# Patient Record
Sex: Female | Born: 1980 | Race: Black or African American | Hispanic: No | Marital: Married | Smoking: Never smoker
Health system: Southern US, Community
[De-identification: ages and names within clinical notes are randomized; demographics above are authoritative.]

## PROBLEM LIST (undated history)

## (undated) ENCOUNTER — Inpatient Hospital Stay (HOSPITAL_COMMUNITY): Payer: Self-pay

## (undated) DIAGNOSIS — F329 Major depressive disorder, single episode, unspecified: Secondary | ICD-10-CM

## (undated) DIAGNOSIS — M797 Fibromyalgia: Secondary | ICD-10-CM

## (undated) DIAGNOSIS — F419 Anxiety disorder, unspecified: Secondary | ICD-10-CM

## (undated) DIAGNOSIS — D649 Anemia, unspecified: Secondary | ICD-10-CM

## (undated) DIAGNOSIS — M069 Rheumatoid arthritis, unspecified: Secondary | ICD-10-CM

## (undated) DIAGNOSIS — K6289 Other specified diseases of anus and rectum: Secondary | ICD-10-CM

## (undated) DIAGNOSIS — F32A Depression, unspecified: Secondary | ICD-10-CM

## (undated) DIAGNOSIS — K5190 Ulcerative colitis, unspecified, without complications: Secondary | ICD-10-CM

## (undated) DIAGNOSIS — Z803 Family history of malignant neoplasm of breast: Secondary | ICD-10-CM

## (undated) HISTORY — DX: Family history of malignant neoplasm of breast: Z80.3

## (undated) HISTORY — DX: Fibromyalgia: M79.7

## (undated) HISTORY — DX: Other specified diseases of anus and rectum: K62.89

---

## 2008-09-18 ENCOUNTER — Encounter: Admission: RE | Admit: 2008-09-18 | Discharge: 2008-09-18 | Payer: Self-pay | Admitting: Family Medicine

## 2011-06-16 DIAGNOSIS — Z3480 Encounter for supervision of other normal pregnancy, unspecified trimester: Secondary | ICD-10-CM | POA: Diagnosis not present

## 2011-06-16 DIAGNOSIS — R946 Abnormal results of thyroid function studies: Secondary | ICD-10-CM | POA: Diagnosis not present

## 2011-07-07 DIAGNOSIS — R110 Nausea: Secondary | ICD-10-CM | POA: Diagnosis not present

## 2011-07-07 DIAGNOSIS — E559 Vitamin D deficiency, unspecified: Secondary | ICD-10-CM | POA: Diagnosis not present

## 2011-07-07 DIAGNOSIS — Z331 Pregnant state, incidental: Secondary | ICD-10-CM | POA: Diagnosis not present

## 2011-07-07 DIAGNOSIS — Z3480 Encounter for supervision of other normal pregnancy, unspecified trimester: Secondary | ICD-10-CM | POA: Diagnosis not present

## 2011-07-08 DIAGNOSIS — O26849 Uterine size-date discrepancy, unspecified trimester: Secondary | ICD-10-CM | POA: Diagnosis not present

## 2011-07-08 DIAGNOSIS — O209 Hemorrhage in early pregnancy, unspecified: Secondary | ICD-10-CM | POA: Diagnosis not present

## 2011-07-09 ENCOUNTER — Other Ambulatory Visit: Payer: Self-pay | Admitting: Obstetrics and Gynecology

## 2011-07-09 DIAGNOSIS — Z3682 Encounter for antenatal screening for nuchal translucency: Secondary | ICD-10-CM

## 2011-07-15 ENCOUNTER — Ambulatory Visit (HOSPITAL_COMMUNITY): Payer: Self-pay

## 2011-08-05 DIAGNOSIS — O4410 Placenta previa with hemorrhage, unspecified trimester: Secondary | ICD-10-CM | POA: Diagnosis not present

## 2011-08-06 DIAGNOSIS — Z3480 Encounter for supervision of other normal pregnancy, unspecified trimester: Secondary | ICD-10-CM | POA: Diagnosis not present

## 2011-08-20 DIAGNOSIS — A7489 Other chlamydial diseases: Secondary | ICD-10-CM | POA: Diagnosis not present

## 2011-09-01 DIAGNOSIS — O4410 Placenta previa with hemorrhage, unspecified trimester: Secondary | ICD-10-CM | POA: Diagnosis not present

## 2011-09-01 DIAGNOSIS — Z1389 Encounter for screening for other disorder: Secondary | ICD-10-CM | POA: Diagnosis not present

## 2011-10-01 DIAGNOSIS — Z118 Encounter for screening for other infectious and parasitic diseases: Secondary | ICD-10-CM | POA: Diagnosis not present

## 2011-10-01 DIAGNOSIS — N898 Other specified noninflammatory disorders of vagina: Secondary | ICD-10-CM | POA: Diagnosis not present

## 2011-10-01 DIAGNOSIS — O9989 Other specified diseases and conditions complicating pregnancy, childbirth and the puerperium: Secondary | ICD-10-CM | POA: Diagnosis not present

## 2011-10-01 DIAGNOSIS — O26899 Other specified pregnancy related conditions, unspecified trimester: Secondary | ICD-10-CM | POA: Diagnosis not present

## 2011-10-21 DIAGNOSIS — Z3480 Encounter for supervision of other normal pregnancy, unspecified trimester: Secondary | ICD-10-CM | POA: Diagnosis not present

## 2011-12-30 DIAGNOSIS — Z3480 Encounter for supervision of other normal pregnancy, unspecified trimester: Secondary | ICD-10-CM | POA: Diagnosis not present

## 2012-01-13 DIAGNOSIS — N898 Other specified noninflammatory disorders of vagina: Secondary | ICD-10-CM | POA: Diagnosis not present

## 2012-01-13 DIAGNOSIS — O9989 Other specified diseases and conditions complicating pregnancy, childbirth and the puerperium: Secondary | ICD-10-CM | POA: Diagnosis not present

## 2012-01-13 DIAGNOSIS — Z8619 Personal history of other infectious and parasitic diseases: Secondary | ICD-10-CM | POA: Diagnosis not present

## 2012-01-19 DIAGNOSIS — Z8619 Personal history of other infectious and parasitic diseases: Secondary | ICD-10-CM | POA: Diagnosis not present

## 2012-01-19 DIAGNOSIS — O98319 Other infections with a predominantly sexual mode of transmission complicating pregnancy, unspecified trimester: Secondary | ICD-10-CM | POA: Diagnosis not present

## 2012-01-19 DIAGNOSIS — Z3480 Encounter for supervision of other normal pregnancy, unspecified trimester: Secondary | ICD-10-CM | POA: Diagnosis not present

## 2012-01-27 ENCOUNTER — Encounter (HOSPITAL_COMMUNITY): Payer: Self-pay | Admitting: *Deleted

## 2012-01-27 ENCOUNTER — Encounter (HOSPITAL_COMMUNITY)
Admission: AD | Disposition: A | Discharge status: Home / Self Care | Payer: Self-pay | Source: Ambulatory Visit | Attending: Obstetrics and Gynecology

## 2012-01-27 ENCOUNTER — Inpatient Hospital Stay (HOSPITAL_COMMUNITY)
Admission: AD | Admit: 2012-01-27 | Discharge: 2012-01-30 | DRG: 765 | Disposition: A | Discharge status: Home / Self Care | Payer: Medicare Other | Source: Ambulatory Visit | Attending: Obstetrics and Gynecology | Admitting: Obstetrics and Gynecology

## 2012-01-27 ENCOUNTER — Inpatient Hospital Stay (HOSPITAL_COMMUNITY): Payer: Medicare Other | Admitting: Anesthesiology

## 2012-01-27 ENCOUNTER — Other Ambulatory Visit: Payer: Self-pay | Admitting: Obstetrics and Gynecology

## 2012-01-27 ENCOUNTER — Inpatient Hospital Stay (HOSPITAL_COMMUNITY): Payer: Medicare Other

## 2012-01-27 ENCOUNTER — Encounter (HOSPITAL_COMMUNITY): Payer: Self-pay | Admitting: Anesthesiology

## 2012-01-27 DIAGNOSIS — O99892 Other specified diseases and conditions complicating childbirth: Secondary | ICD-10-CM | POA: Diagnosis present

## 2012-01-27 DIAGNOSIS — Z22330 Carrier of Group B streptococcus: Secondary | ICD-10-CM

## 2012-01-27 DIAGNOSIS — D696 Thrombocytopenia, unspecified: Secondary | ICD-10-CM | POA: Diagnosis present

## 2012-01-27 DIAGNOSIS — Z98891 History of uterine scar from previous surgery: Secondary | ICD-10-CM

## 2012-01-27 DIAGNOSIS — O9912 Other diseases of the blood and blood-forming organs and certain disorders involving the immune mechanism complicating childbirth: Secondary | ICD-10-CM | POA: Diagnosis present

## 2012-01-27 DIAGNOSIS — O4590 Premature separation of placenta, unspecified, unspecified trimester: Secondary | ICD-10-CM | POA: Diagnosis present

## 2012-01-27 DIAGNOSIS — D689 Coagulation defect, unspecified: Secondary | ICD-10-CM | POA: Diagnosis present

## 2012-01-27 DIAGNOSIS — O4690 Antepartum hemorrhage, unspecified, unspecified trimester: Secondary | ICD-10-CM

## 2012-01-27 DIAGNOSIS — O368390 Maternal care for abnormalities of the fetal heart rate or rhythm, unspecified trimester, not applicable or unspecified: Secondary | ICD-10-CM

## 2012-01-27 DIAGNOSIS — O468X9 Other antepartum hemorrhage, unspecified trimester: Secondary | ICD-10-CM | POA: Diagnosis not present

## 2012-01-27 DIAGNOSIS — Z331 Pregnant state, incidental: Secondary | ICD-10-CM | POA: Diagnosis not present

## 2012-01-27 LAB — CBC
HCT: 35.5 % — ABNORMAL LOW (ref 36.0–46.0)
Hemoglobin: 11.7 g/dL — ABNORMAL LOW (ref 12.0–15.0)
MCH: 27 pg (ref 26.0–34.0)
MCV: 81.8 fL (ref 78.0–100.0)
Platelets: 148 10*3/uL — ABNORMAL LOW (ref 150–400)
RBC: 4.34 MIL/uL (ref 3.87–5.11)
WBC: 8.6 10*3/uL (ref 4.0–10.5)

## 2012-01-27 LAB — KLEIHAUER-BETKE STAIN
Fetal Cells %: 0 %
Quantitation Fetal Hemoglobin: 0 mL

## 2012-01-27 LAB — SAVE SMEAR

## 2012-01-27 LAB — FIBRINOGEN: Fibrinogen: 463 mg/dL (ref 204–475)

## 2012-01-27 LAB — OB RESULTS CONSOLE RUBELLA ANTIBODY, IGM: Rubella: IMMUNE

## 2012-01-27 LAB — TYPE AND SCREEN
Antibody Screen: NEGATIVE
Unit division: 0

## 2012-01-27 LAB — PROTIME-INR
INR: 0.95 (ref 0.00–1.49)
Prothrombin Time: 12.6 seconds (ref 11.6–15.2)

## 2012-01-27 LAB — ABO/RH: ABO/RH(D): B POS

## 2012-01-27 SURGERY — Surgical Case
Anesthesia: Spinal | Wound class: Clean Contaminated

## 2012-01-27 MED ORDER — CITRIC ACID-SODIUM CITRATE 334-500 MG/5ML PO SOLN
30.0000 mL | ORAL | Status: DC | PRN
  Administered 2012-01-27: 30 mL via ORAL
  Filled 2012-01-27: qty 15

## 2012-01-27 MED ORDER — LIDOCAINE HCL (PF) 1 % IJ SOLN: 30.0000 mL | INTRAMUSCULAR | Status: DC | PRN

## 2012-01-27 MED ORDER — OXYTOCIN 40 UNITS IN LACTATED RINGERS INFUSION - SIMPLE MED: 62.5000 mL/h | INTRAVENOUS | Status: DC

## 2012-01-27 MED ORDER — LACTATED RINGERS IV SOLN
INTRAVENOUS | Status: DC
  Administered 2012-01-27 (×3): via INTRAVENOUS

## 2012-01-27 MED ORDER — LACTATED RINGERS IV SOLN: 500.0000 mL | INTRAVENOUS | Status: DC | PRN

## 2012-01-27 MED ORDER — MORPHINE SULFATE 0.5 MG/ML IJ SOLN
INTRAMUSCULAR | Status: AC
  Filled 2012-01-27: qty 10

## 2012-01-27 MED ORDER — LACTATED RINGERS IV SOLN
INTRAVENOUS | Status: DC | PRN
  Administered 2012-01-27 (×2): via INTRAVENOUS

## 2012-01-27 MED ORDER — PHENYLEPHRINE 40 MCG/ML (10ML) SYRINGE FOR IV PUSH (FOR BLOOD PRESSURE SUPPORT)
PREFILLED_SYRINGE | INTRAVENOUS | Status: AC
  Filled 2012-01-27: qty 5

## 2012-01-27 MED ORDER — CEFAZOLIN SODIUM-DEXTROSE 2-3 GM-% IV SOLR
INTRAVENOUS | Status: AC
  Filled 2012-01-27: qty 50

## 2012-01-27 MED ORDER — ONDANSETRON HCL 4 MG/2ML IJ SOLN
INTRAMUSCULAR | Status: DC | PRN
  Administered 2012-01-27: 4 mg via INTRAVENOUS

## 2012-01-27 MED ORDER — TERBUTALINE SULFATE 1 MG/ML IJ SOLN: 0.2500 mg | Freq: Once | INTRAMUSCULAR | Status: DC | PRN

## 2012-01-27 MED ORDER — CEFAZOLIN SODIUM-DEXTROSE 2-3 GM-% IV SOLR
INTRAVENOUS | Status: DC | PRN
  Administered 2012-01-27: 2 g via INTRAVENOUS

## 2012-01-27 MED ORDER — ONDANSETRON HCL 4 MG/2ML IJ SOLN
INTRAMUSCULAR | Status: AC
  Filled 2012-01-27: qty 2

## 2012-01-27 MED ORDER — ONDANSETRON HCL 4 MG/2ML IJ SOLN: 4.0000 mg | Freq: Four times a day (QID) | INTRAMUSCULAR | Status: DC | PRN

## 2012-01-27 MED ORDER — PENICILLIN G POTASSIUM 5000000 UNITS IJ SOLR
2.5000 10*6.[IU] | INTRAVENOUS | Status: DC
  Administered 2012-01-27: 2.5 10*6.[IU] via INTRAVENOUS
  Filled 2012-01-27 (×3): qty 2.5

## 2012-01-27 MED ORDER — KETOROLAC TROMETHAMINE 60 MG/2ML IM SOLN
60.0000 mg | Freq: Once | INTRAMUSCULAR | Status: AC | PRN
  Administered 2012-01-27: 60 mg via INTRAMUSCULAR

## 2012-01-27 MED ORDER — MEPERIDINE HCL 25 MG/ML IJ SOLN
INTRAMUSCULAR | Status: AC
  Filled 2012-01-27: qty 1

## 2012-01-27 MED ORDER — PHENYLEPHRINE HCL 10 MG/ML IJ SOLN
INTRAMUSCULAR | Status: DC | PRN
  Administered 2012-01-27 (×2): 80 ug via INTRAVENOUS
  Administered 2012-01-27: 40 ug via INTRAVENOUS

## 2012-01-27 MED ORDER — OXYTOCIN BOLUS FROM INFUSION
500.0000 mL | INTRAVENOUS | Status: DC
  Filled 2012-01-27: qty 500

## 2012-01-27 MED ORDER — IBUPROFEN 600 MG PO TABS: 600.0000 mg | ORAL_TABLET | Freq: Four times a day (QID) | ORAL | Status: DC | PRN

## 2012-01-27 MED ORDER — FENTANYL CITRATE 0.05 MG/ML IJ SOLN
INTRAMUSCULAR | Status: DC | PRN
  Administered 2012-01-27: 25 ug via INTRATHECAL
  Administered 2012-01-27: 75 ug via INTRAVENOUS

## 2012-01-27 MED ORDER — OXYTOCIN 10 UNIT/ML IJ SOLN
INTRAMUSCULAR | Status: AC
  Filled 2012-01-27: qty 4

## 2012-01-27 MED ORDER — BUTORPHANOL TARTRATE 1 MG/ML IJ SOLN: 1.0000 mg | INTRAMUSCULAR | Status: DC | PRN

## 2012-01-27 MED ORDER — HYDROMORPHONE HCL PF 1 MG/ML IJ SOLN
INTRAMUSCULAR | Status: AC
  Filled 2012-01-27: qty 1

## 2012-01-27 MED ORDER — HYDROMORPHONE HCL PF 1 MG/ML IJ SOLN
0.2500 mg | INTRAMUSCULAR | Status: DC | PRN
  Administered 2012-01-27: 0.5 mg via INTRAVENOUS

## 2012-01-27 MED ORDER — OXYTOCIN 40 UNITS IN LACTATED RINGERS INFUSION - SIMPLE MED
1.0000 m[IU]/min | INTRAVENOUS | Status: DC
  Administered 2012-01-27: 1 m[IU]/min via INTRAVENOUS
  Filled 2012-01-27: qty 1000

## 2012-01-27 MED ORDER — SCOPOLAMINE 1 MG/3DAYS TD PT72
MEDICATED_PATCH | TRANSDERMAL | Status: AC
  Filled 2012-01-27: qty 1

## 2012-01-27 MED ORDER — FENTANYL CITRATE 0.05 MG/ML IJ SOLN
INTRAMUSCULAR | Status: AC
Start: 2012-01-27 — End: 2012-01-27
  Filled 2012-01-27: qty 2

## 2012-01-27 MED ORDER — OXYCODONE-ACETAMINOPHEN 5-325 MG PO TABS: 1.0000 | ORAL_TABLET | ORAL | Status: DC | PRN

## 2012-01-27 MED ORDER — KETOROLAC TROMETHAMINE 60 MG/2ML IM SOLN
INTRAMUSCULAR | Status: AC
  Filled 2012-01-27: qty 2

## 2012-01-27 MED ORDER — OXYTOCIN 40 UNITS IN LACTATED RINGERS INFUSION - SIMPLE MED
INTRAVENOUS | Status: DC | PRN
  Administered 2012-01-27: 40 [IU] via INTRAVENOUS

## 2012-01-27 MED ORDER — SCOPOLAMINE 1 MG/3DAYS TD PT72
1.0000 | MEDICATED_PATCH | Freq: Once | TRANSDERMAL | Status: DC
  Administered 2012-01-27: 1.5 mg via TRANSDERMAL

## 2012-01-27 MED ORDER — DEXTROSE 5 % IV SOLN
5.0000 10*6.[IU] | Freq: Once | INTRAVENOUS | Status: AC
  Administered 2012-01-27: 5 10*6.[IU] via INTRAVENOUS
  Filled 2012-01-27: qty 5

## 2012-01-27 MED ORDER — GLYCOPYRROLATE 0.2 MG/ML IJ SOLN
INTRAMUSCULAR | Status: DC | PRN
  Administered 2012-01-27: 0.2 mg via INTRAVENOUS

## 2012-01-27 MED ORDER — MEPERIDINE HCL 25 MG/ML IJ SOLN
6.2500 mg | INTRAMUSCULAR | Status: DC | PRN
  Administered 2012-01-27: 6.25 mg via INTRAVENOUS

## 2012-01-27 MED ORDER — ACETAMINOPHEN 325 MG PO TABS: 650.0000 mg | ORAL_TABLET | ORAL | Status: DC | PRN

## 2012-01-27 MED ORDER — MORPHINE SULFATE (PF) 0.5 MG/ML IJ SOLN
INTRAMUSCULAR | Status: DC | PRN
  Administered 2012-01-27: .15 ug via INTRATHECAL

## 2012-01-27 SURGICAL SUPPLY — 37 items
BARRIER ADHS 3X4 INTERCEED (GAUZE/BANDAGES/DRESSINGS) ×2 IMPLANT
BENZOIN TINCTURE PRP APPL 2/3 (GAUZE/BANDAGES/DRESSINGS) ×4 IMPLANT
CLOTH BEACON ORANGE TIMEOUT ST (SAFETY) ×2 IMPLANT
DERMABOND ADVANCED (GAUZE/BANDAGES/DRESSINGS)
DERMABOND ADVANCED .7 DNX12 (GAUZE/BANDAGES/DRESSINGS) IMPLANT
DRAPE SURG 17X23 STRL (DRAPES) ×2 IMPLANT
DRESSING TELFA 8X3 (GAUZE/BANDAGES/DRESSINGS) ×2 IMPLANT
DRSG COVADERM 4X10 (GAUZE/BANDAGES/DRESSINGS) IMPLANT
DURAPREP 26ML APPLICATOR (WOUND CARE) ×2 IMPLANT
ELECT REM PT RETURN 9FT ADLT (ELECTROSURGICAL) ×2
ELECTRODE REM PT RTRN 9FT ADLT (ELECTROSURGICAL) ×1 IMPLANT
EXTRACTOR VACUUM BELL STYLE (SUCTIONS) IMPLANT
GAUZE SPONGE 4X4 12PLY STRL LF (GAUZE/BANDAGES/DRESSINGS) ×4 IMPLANT
GLOVE BIO SURGEON STRL SZ7 (GLOVE) ×2 IMPLANT
GLOVE BIOGEL PI IND STRL 7.0 (GLOVE) ×2 IMPLANT
GLOVE BIOGEL PI INDICATOR 7.0 (GLOVE) ×2
GOWN PREVENTION PLUS LG XLONG (DISPOSABLE) ×4 IMPLANT
GOWN PREVENTION PLUS XLARGE (GOWN DISPOSABLE) IMPLANT
KIT ABG SYR 3ML LUER SLIP (SYRINGE) IMPLANT
NEEDLE HYPO 25X5/8 SAFETYGLIDE (NEEDLE) IMPLANT
NS IRRIG 1000ML POUR BTL (IV SOLUTION) ×2 IMPLANT
PACK C SECTION WH (CUSTOM PROCEDURE TRAY) ×2 IMPLANT
PAD ABD 7.5X8 STRL (GAUZE/BANDAGES/DRESSINGS) ×4 IMPLANT
PAD OB MATERNITY 4.3X12.25 (PERSONAL CARE ITEMS) IMPLANT
RTRCTR C-SECT PINK 25CM LRG (MISCELLANEOUS) ×2 IMPLANT
SLEEVE SCD COMPRESS KNEE MED (MISCELLANEOUS) IMPLANT
STRIP CLOSURE SKIN 1/2X4 (GAUZE/BANDAGES/DRESSINGS) ×2 IMPLANT
SUT CHROMIC 0 CTX 36 (SUTURE) ×10 IMPLANT
SUT PLAIN 2 0 (SUTURE)
SUT PLAIN 2 0 XLH (SUTURE) ×2 IMPLANT
SUT PLAIN ABS 2-0 54XMFL TIE (SUTURE) IMPLANT
SUT VIC AB 0 CT1 27 (SUTURE) ×2
SUT VIC AB 0 CT1 27XBRD ANBCTR (SUTURE) ×2 IMPLANT
SUT VIC AB 4-0 KS 27 (SUTURE) ×2 IMPLANT
TOWEL OR 17X24 6PK STRL BLUE (TOWEL DISPOSABLE) ×4 IMPLANT
TRAY FOLEY CATH 14FR (SET/KITS/TRAYS/PACK) ×4 IMPLANT
WATER STERILE IRR 1000ML POUR (IV SOLUTION) ×2 IMPLANT

## 2012-01-27 NOTE — Transfer of Care (Signed)
Immediate Anesthesia Transfer of Care Note  Patient: Brittany Vasquez  Procedure(s) Performed: Procedure(s) (LRB) with comments: CESAREAN SECTION (N/A) - Primary Cesarean Section with birth of baby boy @ 33  Patient Location: PACU  Anesthesia Type: Spinal  Level of Consciousness: awake, alert  and oriented  Airway & Oxygen Therapy: Patient Spontanous Breathing  Post-op Assessment: Report given to PACU RN and Post -op Vital signs reviewed and stable  Post vital signs: Reviewed and stable  Complications: No apparent anesthesia complications

## 2012-01-27 NOTE — Progress Notes (Signed)
Called Dr Dion Body to update on FHR, UCs, Pit on 1 mU/min. D/c Pit, check cervix/assess bleeding, call if no accels x30 min.

## 2012-01-27 NOTE — Anesthesia Procedure Notes (Signed)
Spinal  Patient location during procedure: OR Preanesthetic Checklist Completed: patient identified, site marked, surgical consent, pre-op evaluation, timeout performed, IV checked, risks and benefits discussed and monitors and equipment checked Spinal Block Patient position: sitting Prep: DuraPrep Patient monitoring: heart rate, cardiac monitor, continuous pulse ox and blood pressure Approach: midline Location: L3-4 Injection technique: single-shot Needle Needle type: Sprotte  Needle gauge: 24 G Needle length: 9 cm Assessment Sensory level: T4 Additional Notes Spinal Dosage in OR  Bupivicaine ml       1.7 PFMS04   mcg        150 Fentanyl mcg            25    

## 2012-01-27 NOTE — H&P (Signed)
Brittany Vasquez is a 31 y.o. female G2 P1001 at 37 1/7 weeks admitting for vaginal bleeding.  Pt presented at the office with a complaint of malaise, occ contractions overnight and vaginal bleeding.  Pt denies significant LOF except bleeding.  Fetus has been active. Thought pt was in labor but cervix was closed and thick.  Moderate blood noted in the vault.  Pregnancy was complicated by Chlamydia at 10 weeks, neg TOC x 2.  Complete placenta previa noted at 12 weeks, resolved at 19 week ultrasound.  GBS+  Maternal Medical History:  Reason for admission: Reason for admission: vaginal bleeding.  Contractions: Onset was 6-12 hours ago.   Frequency: rare.   Perceived severity is mild.    Fetal activity: Perceived fetal activity is normal.    Prenatal complications: Marginal placenta previa. Resolved at 19 weeks.  Prenatal Complications - Diabetes: none.    OB History    Grav Para Term Preterm Abortions TAB SAB Ect Mult Living   2 1 1  0 0 0 0 0 0 1     Past Medical History  Diagnosis Date  . No pertinent past medical history    Past Surgical History  Procedure Date  . No past surgeries    Family History: family history is not on file. Social History:  reports that she has never smoked. She has never used smokeless tobacco. She reports that she does not drink alcohol or use illicit drugs.   Prenatal Transfer Tool  Maternal Diabetes: No Genetic Screening: Declined Maternal Ultrasounds/Referrals: Abnormal:  Findings:   Isolated EIF (echogenic intracardiac focus), Other: AFI 6.5 day of delivery.  Complete placenta previa resolved. Fetal Ultrasounds or other Referrals:  None Maternal Substance Abuse:  No Significant Maternal Medications:  None  Zithromax for Chlamydia Significant Maternal Lab Results:  Lab values include: Group B Strep positive, Other: Chlamydia 1st trimester Other Comments:  None  Review of Systems  Constitutional: Negative for fever and chills.    Gastrointestinal: Positive for abdominal pain.  Musculoskeletal: Positive for back pain.   Closed by my exam.  RN states 1 cm. Dilation: 1 Effacement (%): 50 Station: -2 Exam by:: Dr Dion Body Blood pressure 92/64, pulse 62, temperature 98.2 F (36.8 C), temperature source Oral, resp. rate 20, height 5\' 7"  (1.702 m), weight 83.462 kg (184 lb), SpO2 96.00%. Maternal Exam:  Uterine Assessment: Contraction strength is moderate.  Contraction duration is 2 minutes. Contraction frequency is irregular.   Abdomen: Estimated fetal weight is 6 pounds.   Fetal presentation: vertex  Introitus: Normal vulva. Vagina is positive for vaginal discharge.  Ferning test: negative.  Amniotic fluid character: not assessed.  Pelvis: adequate for delivery.   Cervix: Cervix evaluated by digital exam.     Fetal Exam Fetal Monitor Review: Baseline rate: 140s, reactive.  .  Variability: moderate (6-25 bpm).   Pattern: late decelerations.    Fetal State Assessment: Category I - tracings are normal. Reassuring if not contracting.  Physical Exam  Constitutional: She is oriented to person, place, and time. She appears well-developed and well-nourished. No distress.  HENT:  Head: Atraumatic.       Caput narrow  Eyes: EOM are normal.  Neck: Normal range of motion.  GI: There is no tenderness. There is no rebound and no guarding.  Genitourinary: Vaginal discharge found.       No active bleeding but small amount of blood in vault. Small clot.  Musculoskeletal: She exhibits no edema and no tenderness.  Neurological: She is  alert and oriented to person, place, and time.  Skin: Skin is warm and dry. She is not diaphoretic.  Psychiatric: She has a normal mood and affect.    Prenatal labs: ABO, Rh: --/--/B POS, B POS (10/24 1255) Antibody: NEG (10/24 1255) Rubella: Immune (10/24 0000) RPR: Nonreactive (10/24 0000)  HBsAg: Negative (10/24 0000)  HIV: Non-reactive (10/24 0000)  GBS: Positive (10/24 0000)    Ultrasound- No obvious placental abruption, AFI 6.5 KB stain neg, DIC panel neg. Hg 11.7, platelets 148  Assessment/Plan: Pregnancy at 40 1/7 weeks. Vaginal bleeding-If pt were laboring and dilated, bleeding could possible be c/w bloody show although it would be a little more than anticipated.  Due to late decelerations, closed cervix (ie- not in labor), I suspect pt may have a mild placental abruption.  Pt with h/o complete placenta previa noted in first trimester but had resolved in second trimester. In that pt had late decels with 1 mu of Pitocin, I recommend proceeding with c-section b/c baby will likely not tolerate labor and abruption could worsening requiring emergency c-section with risk to fetus and pt.  Fetal tracing reviewed with pt and FOB, discussed concerns and rationale of recommendations. All questions answered.  R/B/A reviewed with pt, all questions answered.  Consent signed.     Geryl Rankins 01/27/2012, 8:38 PM

## 2012-01-27 NOTE — Brief Op Note (Signed)
01/27/2012  8:07 PM  PATIENT:  Arby Barrette  31 y.o. female  PRE-OPERATIVE DIAGNOSIS:  Pregnancy at 40 1/7 weeks, Non-Reassuring Fetal Heart Rate; Possible Placental Abruption, Vaginal bleeding, Low AFI  POST-OPERATIVE DIAGNOSIS:  Same,  nuchal cord x's 2, moderate meconium  PROCEDURE:  Procedure(s) (LRB) with comments: CESAREAN SECTION (N/A) - Primary Cesarean Section with birth of baby boy @ 1907  SURGEON:  Surgeon(s) and Role:    * Geryl Rankins, MD - Primary  PHYSICIAN ASSISTANT: none  ASSISTANTS: none   ANESTHESIA:   spinal  EBL:  Total I/O In: -  Out: 600 [Blood:600]  BLOOD ADMINISTERED:none  DRAINS: Urinary Catheter (Foley)   LOCAL MEDICATIONS USED:  NONE  SPECIMEN:  Source of Specimen:  Placenta  DISPOSITION OF SPECIMEN:  PATHOLOGY  COUNTS:  YES  TOURNIQUET:  * No tourniquets in log *  DICTATION: .Other Dictation: Dictation Number [id number]  PLAN OF CARE: Admit to inpatient   PATIENT DISPOSITION:  PACU - hemodynamically stable.   Delay start of Pharmacological VTE agent (>24hrs) due to surgical blood loss or risk of bleeding: not applicable

## 2012-01-27 NOTE — Anesthesia Postprocedure Evaluation (Signed)
Anesthesia Post Note  Patient: Brittany Vasquez  Procedure(s) Performed: Procedure(s) (LRB): CESAREAN SECTION (N/A)  Anesthesia type: Spinal  Patient location: PACU  Post pain: Pain level controlled  Post assessment: Post-op Vital signs reviewed  Last Vitals:  Filed Vitals:   01/27/12 2245  BP: 94/53  Pulse: 51  Temp:   Resp: 21    Post vital signs: Reviewed  Level of consciousness: awake  Complications: No apparent anesthesia complications

## 2012-01-27 NOTE — Anesthesia Preprocedure Evaluation (Signed)

## 2012-01-28 LAB — CBC
HCT: 30.6 % — ABNORMAL LOW (ref 36.0–46.0)
Hemoglobin: 10 g/dL — ABNORMAL LOW (ref 12.0–15.0)
MCH: 27.2 pg (ref 26.0–34.0)
MCHC: 32.7 g/dL (ref 30.0–36.0)
RBC: 3.67 MIL/uL — ABNORMAL LOW (ref 3.87–5.11)

## 2012-01-28 MED ORDER — ONDANSETRON HCL 4 MG/2ML IJ SOLN: 4.0000 mg | Freq: Three times a day (TID) | INTRAMUSCULAR | Status: DC | PRN

## 2012-01-28 MED ORDER — SODIUM CHLORIDE 0.9 % IJ SOLN: 3.0000 mL | INTRAMUSCULAR | Status: DC | PRN

## 2012-01-28 MED ORDER — METHYLERGONOVINE MALEATE 0.2 MG PO TABS: 0.2000 mg | ORAL_TABLET | ORAL | Status: DC | PRN

## 2012-01-28 MED ORDER — NALBUPHINE HCL 10 MG/ML IJ SOLN
5.0000 mg | INTRAMUSCULAR | Status: DC | PRN
  Filled 2012-01-28 (×2): qty 1

## 2012-01-28 MED ORDER — DIPHENHYDRAMINE HCL 50 MG/ML IJ SOLN: 12.5000 mg | INTRAMUSCULAR | Status: DC | PRN

## 2012-01-28 MED ORDER — ACETAMINOPHEN 10 MG/ML IV SOLN
1000.0000 mg | Freq: Once | INTRAVENOUS | Status: AC
  Administered 2012-01-28: 1000 mg via INTRAVENOUS
  Filled 2012-01-28: qty 100

## 2012-01-28 MED ORDER — DIPHENHYDRAMINE HCL 50 MG/ML IJ SOLN: 25.0000 mg | INTRAMUSCULAR | Status: DC | PRN

## 2012-01-28 MED ORDER — ONDANSETRON HCL 4 MG PO TABS: 4.0000 mg | ORAL_TABLET | ORAL | Status: DC | PRN

## 2012-01-28 MED ORDER — ZOLPIDEM TARTRATE 5 MG PO TABS: 5.0000 mg | ORAL_TABLET | Freq: Every evening | ORAL | Status: DC | PRN

## 2012-01-28 MED ORDER — SIMETHICONE 80 MG PO CHEW: 80.0000 mg | CHEWABLE_TABLET | ORAL | Status: DC | PRN

## 2012-01-28 MED ORDER — METHYLERGONOVINE MALEATE 0.2 MG/ML IJ SOLN: 0.2000 mg | INTRAMUSCULAR | Status: DC | PRN

## 2012-01-28 MED ORDER — WITCH HAZEL-GLYCERIN EX PADS: 1.0000 "application " | MEDICATED_PAD | CUTANEOUS | Status: DC | PRN

## 2012-01-28 MED ORDER — DIPHENHYDRAMINE HCL 25 MG PO CAPS: 25.0000 mg | ORAL_CAPSULE | Freq: Four times a day (QID) | ORAL | Status: DC | PRN

## 2012-01-28 MED ORDER — TETANUS-DIPHTH-ACELL PERTUSSIS 5-2.5-18.5 LF-MCG/0.5 IM SUSP: 0.5000 mL | Freq: Once | INTRAMUSCULAR | Status: DC

## 2012-01-28 MED ORDER — IBUPROFEN 600 MG PO TABS: 600.0000 mg | ORAL_TABLET | Freq: Four times a day (QID) | ORAL | Status: DC | PRN

## 2012-01-28 MED ORDER — CEFAZOLIN SODIUM-DEXTROSE 2-3 GM-% IV SOLR
2.0000 g | INTRAVENOUS | Status: DC
  Filled 2012-01-28: qty 50

## 2012-01-28 MED ORDER — IBUPROFEN 600 MG PO TABS
600.0000 mg | ORAL_TABLET | Freq: Four times a day (QID) | ORAL | Status: DC
  Administered 2012-01-28 – 2012-01-30 (×10): 600 mg via ORAL
  Filled 2012-01-28 (×10): qty 1

## 2012-01-28 MED ORDER — OXYTOCIN 40 UNITS IN LACTATED RINGERS INFUSION - SIMPLE MED: 62.5000 mL/h | INTRAVENOUS | Status: AC

## 2012-01-28 MED ORDER — SENNOSIDES-DOCUSATE SODIUM 8.6-50 MG PO TABS
2.0000 | ORAL_TABLET | Freq: Every day | ORAL | Status: DC
  Administered 2012-01-28 – 2012-01-29 (×2): 2 via ORAL

## 2012-01-28 MED ORDER — OXYCODONE-ACETAMINOPHEN 5-325 MG PO TABS
1.0000 | ORAL_TABLET | ORAL | Status: DC | PRN
  Administered 2012-01-28 (×2): 2 via ORAL
  Administered 2012-01-28: 1 via ORAL
  Administered 2012-01-28: 2 via ORAL
  Administered 2012-01-28 (×2): 1 via ORAL
  Administered 2012-01-29: 2 via ORAL
  Administered 2012-01-29 (×2): 1 via ORAL
  Administered 2012-01-29 – 2012-01-30 (×3): 2 via ORAL
  Filled 2012-01-28 (×5): qty 2
  Filled 2012-01-28: qty 1
  Filled 2012-01-28 (×5): qty 2

## 2012-01-28 MED ORDER — KETOROLAC TROMETHAMINE 30 MG/ML IJ SOLN: 30.0000 mg | Freq: Four times a day (QID) | INTRAMUSCULAR | Status: AC | PRN

## 2012-01-28 MED ORDER — ONDANSETRON HCL 4 MG/2ML IJ SOLN: 4.0000 mg | INTRAMUSCULAR | Status: DC | PRN

## 2012-01-28 MED ORDER — SODIUM CHLORIDE 0.9 % IV SOLN
1.0000 ug/kg/h | INTRAVENOUS | Status: DC | PRN
  Filled 2012-01-28: qty 2.5

## 2012-01-28 MED ORDER — METOCLOPRAMIDE HCL 5 MG/ML IJ SOLN: 10.0000 mg | Freq: Three times a day (TID) | INTRAMUSCULAR | Status: DC | PRN

## 2012-01-28 MED ORDER — PRENATAL MULTIVITAMIN CH
1.0000 | ORAL_TABLET | Freq: Every day | ORAL | Status: DC
  Administered 2012-01-28 – 2012-01-30 (×3): 1 via ORAL
  Filled 2012-01-28 (×3): qty 1

## 2012-01-28 MED ORDER — DIBUCAINE 1 % RE OINT: 1.0000 "application " | TOPICAL_OINTMENT | RECTAL | Status: DC | PRN

## 2012-01-28 MED ORDER — DIPHENHYDRAMINE HCL 25 MG PO CAPS: 25.0000 mg | ORAL_CAPSULE | ORAL | Status: DC | PRN

## 2012-01-28 MED ORDER — LANOLIN HYDROUS EX OINT: 1.0000 "application " | TOPICAL_OINTMENT | CUTANEOUS | Status: DC | PRN

## 2012-01-28 MED ORDER — NALBUPHINE HCL 10 MG/ML IJ SOLN
5.0000 mg | INTRAMUSCULAR | Status: DC | PRN
  Administered 2012-01-28 (×2): 10 mg via INTRAVENOUS
  Filled 2012-01-28 (×2): qty 1

## 2012-01-28 MED ORDER — MENTHOL 3 MG MT LOZG: 1.0000 | LOZENGE | OROMUCOSAL | Status: DC | PRN

## 2012-01-28 MED ORDER — LACTATED RINGERS IV SOLN
INTRAVENOUS | Status: DC
  Administered 2012-01-28: 02:00:00 via INTRAVENOUS

## 2012-01-28 MED ORDER — NALOXONE HCL 0.4 MG/ML IJ SOLN: 0.4000 mg | INTRAMUSCULAR | Status: DC | PRN

## 2012-01-28 MED ORDER — SIMETHICONE 80 MG PO CHEW
80.0000 mg | CHEWABLE_TABLET | Freq: Three times a day (TID) | ORAL | Status: DC
  Administered 2012-01-28 – 2012-01-30 (×6): 80 mg via ORAL

## 2012-01-28 MED ORDER — FERROUS SULFATE 325 (65 FE) MG PO TABS
325.0000 mg | ORAL_TABLET | Freq: Two times a day (BID) | ORAL | Status: DC
  Administered 2012-01-28 – 2012-01-30 (×5): 325 mg via ORAL
  Filled 2012-01-28 (×5): qty 1

## 2012-01-28 NOTE — Progress Notes (Signed)
UR chart review completed.  

## 2012-01-28 NOTE — Op Note (Signed)
NAMEROSELYN, VALENSUELA NO.:  [id number]  MEDICAL RECORD NO.:  [id number]  LOCATION:  [facility]                          FACILITY:  [facility]  PHYSICIAN:  Pieter Partridge, MD   DATE OF BIRTH:  28-Apr-1980  DATE OF PROCEDURE: DATE OF DISCHARGE:                              OPERATIVE REPORT   PREOPERATIVE DIAGNOSES:  At 31 and 1/7th weeks, nonreassuring fetal heart rate, possible placental abruption, vaginal bleeding, low amniotic fluid.  POSTOPERATIVE DIAGNOSES:  At 31 and 1/7th weeks, nonreassuring fetal heart rate, possible placental abruption, vaginal bleeding, low amniotic fluid.  Nuchal cord x2.  Moderate meconium.  PROCEDURE:  Primary low transverse cesarean section.  SURGEON:  Pieter Partridge, MD  ASSISTANT:  Technician.  ANESTHESIA:  Spinal.  EBL:  600.  URINE OUTPUT:  450, clear.  BLOOD ADMINISTERED:  None.  DRAINS:  Foley catheter.  No local medications.  SPECIMENS:  Placenta.  DISPOSITION:  Disposition of specimen to pathology.  The patient disposition to PACU, hemodynamically stable.  COMPLICATIONS:  None.  FINDINGS:  A viable female infant, Apgars 8 and 9.  Spontaneous cry. Nuchal cord x2.  Moderate meconium noted.  Placenta was without any obvious signs of abruption or clot.  Normal uterus and normal fallopian tubes, and ovaries on the right hand side.  INDICATIONS:  The patient is a 31 year old, gravida 2, para 1-0-0-1, at 31 and 1/7th weeks who presented to the office for a routine OB visit, and at that time, had a complaint of vaginal bleeding.  Moderate amount of blood was pooling in the vagina.  The cervical exam revealed a closed cervix and thick, did not feel labored.  She was then admitted to labor and delivery for observation and for potential induction of labor due to vaginal bleeding.  Upon arrival, she had a late deceleration with 1 contraction.  However, after the contraction, the baby was reactive, and remained reactive.   Prior to starting Pitocin, she did have a small subtle late deceleration, but the baby was overall reassuring.  Pitocin was started at 1 million units, and she had a few contractions back-to- back and the baby had a late deceleration approximately 3 minutes and again return to baseline reactive.  Again, baby still had a few late deceleration.  An ultrasound showed that there was no obvious placental abruption, however the amniotic fluid was subjectively low.  The ferning was negative for ruptured membranes, and at the time of the exam prior to calling the C-section, there was still some oozing in the vagina and a small blood clot noted.  Cervix remained closed.  Due to nonreassuring fetal tracing, remote from delivery, the patient was counseled that she potentially had mild abruption because of the baby's reassuring fetal status when not contracting.  It was recommended to proceed with delivery, however.  She was given a option to resume Pitocin to potentially do an induction of labor if she refused C-section.  After long discussion and answering all her questions, the patient desired to proceed with primary cesarean section.  Informed consents were signed.  The patient was taken to the operating room, where she underwent spinal anesthesia without complication.  She was then prepped and  draped in the normal sterile fashion.  Abdomen was marked for a Pfannenstiel skin incision.  A Pfannenstiel incision was made 2 cm above the symphysis pubis with the scalpel and carried down to the underlying layer of the fascia with the Bovie. Hemostasis with Bovie cautery and hemostats was needed once.  The fascia was identified and entered with the Bovie.  The fascial incision was then extended laterally with the curved Mayo scissors.  The fascia was then grasped with the Kocher clamps on top and the rectus and fascia, muscles were dissected sharply.  The same was done on the lower edge. The rectus  muscles were noted to be well approximated and no diastasis noted.  Hemostats were used to separate the muscles and identify the peritoneum.  They were then opened.  The peritoneum was identified and grasped with 2 hemostats and entered sharply with the Metzenbaum scissors.  Peritoneum was then stretched.  Alexis retractor was then inserted.  The lower uterine segment was identified.  The serosa was entered sharply with the Metzenbaum scissors, and I attempted to make a bladder flap, but this was an optimal bladder flap.  A transverse incision was then made on the uterus and the bulging amniotic membranes were noted. The incision was then extended laterally with the bandage scissors, and the membranes were then ruptured noting moderate meconium.  The head was delivered through the incision atraumatically.  Mouth and nose were suctioned.  The nuchal cord x2 was easily reduced, although second loop was a little tight.  The body was delivered easily.  Nose and mouth were suctioned again.  Cord clamped x2.  Baby handed off to the awaiting NICU team.  I attempted to get a cord gas, but not enough blood was available.  Cord blood was obtained.  The placenta was removed.  It was posterior and low and I inspected it for any clots or missing cotyledons, but everything appeared to be normal. The membranes were meconium stained.  Moist laparotomy sponge was used to clean out the uterus of all clots and debris.  No other membranes were noted and the uterine cavity was felt normal.  The hysterotomy incision was then reapproximated with 0 chromic in a running, locked fashion.  A second layer of the same suture was used for imbrication.  There was bleeding that did not respond to the Bovie.  The bladder flap was not hemostatic, so that was reapproximated with a 3-0 Vicryl in a continuous fashion.  Once hemostasis was achieved, the abdomen and the gutters were irrigated.  All clots were removed.  The  Interceed was applied over the hysterotomy incision.  The bladder was well out of the way of the incision and appeared normal and without injury.  Urine was clear during the surgery.  There was in the left lower quadrant bowel that came out, and a moist laparotomy sponge was used to pack that bowel away.  It was removed prior to removing the Allis retractor.  The rectus muscles and peritoneum was reapproximated with the 0 chromic in 1 U stitch and a few single interrupted sutures.  The fascia was then reapproximated with 0 Vicryl in a continuous running fashion, and the subcutaneous base was then reapproximated with 2-0 plain gut on a CT 1. The skin was then reapproximated with 4-0 Vicryl on a Keith needle. Prior to closure of all layers, irrigation was performed and hemostasis was achieved.  The wound will be dressed with Steri-Strips and a pressure dressing. The  patient received Ancef 2 g IV prior to the procedure.  She had SCDs on throughout the case.  All instrument, sponge, and needle counts were correct x3.  Baby remained with mother skin to skin during the procedure and did very well.  Of note, when the baby was delivered, there was a spontaneous cry, and the baby was vigorous.     Pieter Partridge, MD     EBV/MEDQ  D:  01/27/2012  T:  01/28/2012  Job:  [id number]

## 2012-01-28 NOTE — Progress Notes (Signed)
Post Partum Day 1 s/p cesarean section  Subjective: no complaints, up ad lib and tolerating PO  Objective: Blood pressure 102/56, pulse 55, temperature 97.6 F (36.4 C), temperature source Oral, resp. rate 16, height 5\' 7"  (1.702 m), weight 83.462 kg (184 lb), SpO2 98.00%, unknown if currently breastfeeding.  Physical Exam:  General: alert and cooperative Lochia: appropriate Uterine Fundus: firm Incision: bandage clean dry and intact  DVT Evaluation: No evidence of DVT seen on physical exam.   Basename 01/28/12 0525 01/27/12 1255  HGB 10.0* 11.7*  HCT 30.6* 35.5*    Assessment/Plan: POD #1 s/p cesarean section.  Thrombocytopenia... Check cbc in am Plan for discharge 10/26 oor 10/27 Pt states that Dr. Dion Body is planning to perform the circumcision on her son   LOS: 1 day   Brittany Vasquez J. 01/28/2012, 7:47 AM

## 2012-01-29 ENCOUNTER — Encounter (HOSPITAL_COMMUNITY): Payer: Self-pay

## 2012-01-29 LAB — CBC
HCT: 29.2 % — ABNORMAL LOW (ref 36.0–46.0)
MCH: 27.5 pg (ref 26.0–34.0)
MCV: 83.7 fL (ref 78.0–100.0)
RBC: 3.49 MIL/uL — ABNORMAL LOW (ref 3.87–5.11)
WBC: 8.8 10*3/uL (ref 4.0–10.5)

## 2012-01-29 NOTE — Progress Notes (Signed)
Post Partum Day 2 CSection Subjective: no complaints, up ad lib and tolerating PO  Objective: Blood pressure 93/54, pulse 59, temperature 97.8 F (36.6 C), temperature source Oral, resp. rate 18, height 5\' 7"  (1.702 m), weight 83.462 kg (184 lb), SpO2 98.00%, unknown if currently breastfeeding.  Physical Exam:  General: alert, cooperative and no distress Lochia: appropriate Uterine Fundus: firm Episiotomy, laceration : na DVT Evaluation: No evidence of DVT seen on physical exam.   Basename 01/29/12 0500 01/28/12 0525  HGB 9.6* 10.0*  HCT 29.2* 30.6*    Assessment/Plan: Plan for discharge tomorrow   LOS: 2 days   Brittany Vasquez E 01/29/2012, 11:14 AM

## 2012-01-30 LAB — PLATELET COUNT: Platelets: 127 10*3/uL — ABNORMAL LOW (ref 150–400)

## 2012-01-30 MED ORDER — DIPHENHYDRAMINE HCL 25 MG PO CAPS: 25.0000 mg | ORAL_CAPSULE | Freq: Three times a day (TID) | ORAL | Status: DC | PRN

## 2012-01-30 NOTE — Discharge Summary (Signed)
Obstetric Discharge Summary Reason for Admission: term pregnancy, vaginal bleeding Prenatal Procedures: none Intrapartum Procedures: cesarean: low cervical, transverse Postpartum Procedures: none Complications-Operative and Postpartum: low platelets  Hemoglobin  Date Value Range Status  01/29/2012 9.6* 12.0 - 15.0 g/dL Final     HCT  Date Value Range Status  01/29/2012 29.2* 36.0 - 46.0 % Final    Discharge Diagnoses: Term Pregnancy-delivered and low platelets  Discharge Information: Date: 01/30/2012 Activity: pelvic rest Diet: routine Medications: PNV, Colace, Iron and Percocet Condition: stable and improved Instructions: refer to practice specific booklet Discharge to: home   Newborn Data: Live born  Information for the patient's newborn:  [name], [name] [[id number]]  female ; APGAR , ; weight ;  Home with mother.  Brittany Vasquez E 01/30/2012, 10:47 AM

## 2012-01-30 NOTE — Progress Notes (Signed)
Subjective: Postpartum Day 3: Cesarean Delivery Patient reports tolerating PO and no problems voiding.   Rash on right forearm  Objective: Vital signs in last 24 hours: Temp:  [97.3 F (36.3 C)-98 F (36.7 C)] 97.8 F (36.6 C) (10/27 0604) Pulse Rate:  [59-64] 59  (10/27 0604) Resp:  [18] 18  (10/27 0604) BP: (96-112)/(52-73) 112/73 mmHg (10/27 0604)  Physical Exam:  General: alert, cooperative and no distress Lochia: appropriate Uterine Fundus: firm Incision: healing well DVT Evaluation: No evidence of DVT seen on physical exam. Ext:  Rash on forearms, R>L with itching on the right.   Basename 01/29/12 0500 01/28/12 0525  HGB 9.6* 10.0*  HCT 29.2* 30.6*   Plts 127,000 today Assessment/Plan: Status post Cesarean section. Doing well postoperatively.  Rash Benadryl 25mg  p. o q 8 hrs prn.  FU with Dr Dion Body if symptoms persist  Percocet prescription given. OTC stool softners or laxative as needed. Continue prenatal vitamins and iron .  Sariya Trickey E 01/30/2012, 10:33 AM

## 2012-01-31 ENCOUNTER — Encounter (HOSPITAL_COMMUNITY): Payer: Self-pay | Admitting: Obstetrics and Gynecology

## 2012-02-01 ENCOUNTER — Other Ambulatory Visit: Payer: Self-pay | Admitting: Obstetrics and Gynecology

## 2012-03-10 DIAGNOSIS — M545 Low back pain: Secondary | ICD-10-CM | POA: Diagnosis not present

## 2012-03-13 ENCOUNTER — Other Ambulatory Visit: Payer: Self-pay | Admitting: Family Medicine

## 2012-03-13 DIAGNOSIS — M545 Low back pain: Secondary | ICD-10-CM

## 2012-03-18 ENCOUNTER — Ambulatory Visit
Admission: RE | Admit: 2012-03-18 | Discharge: 2012-03-18 | Disposition: A | Discharge status: Home / Self Care | Payer: Medicare Other | Source: Ambulatory Visit | Attending: Family Medicine | Admitting: Family Medicine

## 2012-03-18 DIAGNOSIS — M47817 Spondylosis without myelopathy or radiculopathy, lumbosacral region: Secondary | ICD-10-CM | POA: Diagnosis not present

## 2012-03-18 DIAGNOSIS — M545 Low back pain: Secondary | ICD-10-CM

## 2012-03-31 DIAGNOSIS — H109 Unspecified conjunctivitis: Secondary | ICD-10-CM | POA: Diagnosis not present

## 2012-04-08 DIAGNOSIS — L0231 Cutaneous abscess of buttock: Secondary | ICD-10-CM | POA: Diagnosis not present

## 2012-04-17 DIAGNOSIS — J329 Chronic sinusitis, unspecified: Secondary | ICD-10-CM | POA: Diagnosis not present

## 2012-04-17 DIAGNOSIS — R404 Transient alteration of awareness: Secondary | ICD-10-CM | POA: Diagnosis not present

## 2012-04-17 DIAGNOSIS — L0231 Cutaneous abscess of buttock: Secondary | ICD-10-CM | POA: Diagnosis not present

## 2012-04-17 DIAGNOSIS — L03317 Cellulitis of buttock: Secondary | ICD-10-CM | POA: Diagnosis not present

## 2012-04-25 DIAGNOSIS — Z79899 Other long term (current) drug therapy: Secondary | ICD-10-CM | POA: Diagnosis not present

## 2012-04-25 DIAGNOSIS — R5381 Other malaise: Secondary | ICD-10-CM | POA: Diagnosis not present

## 2012-04-25 DIAGNOSIS — R404 Transient alteration of awareness: Secondary | ICD-10-CM | POA: Diagnosis not present

## 2012-04-25 DIAGNOSIS — R5383 Other fatigue: Secondary | ICD-10-CM | POA: Diagnosis not present

## 2012-04-28 ENCOUNTER — Other Ambulatory Visit: Payer: Self-pay | Admitting: Neurology

## 2012-04-28 DIAGNOSIS — R531 Weakness: Secondary | ICD-10-CM

## 2012-04-28 DIAGNOSIS — R202 Paresthesia of skin: Secondary | ICD-10-CM

## 2012-05-05 ENCOUNTER — Ambulatory Visit
Admission: RE | Admit: 2012-05-05 | Discharge: 2012-05-05 | Disposition: A | Discharge status: Home / Self Care | Payer: Medicare Other | Source: Ambulatory Visit | Attending: Neurology | Admitting: Neurology

## 2012-05-05 DIAGNOSIS — R202 Paresthesia of skin: Secondary | ICD-10-CM

## 2012-05-05 DIAGNOSIS — R5381 Other malaise: Secondary | ICD-10-CM | POA: Diagnosis not present

## 2012-05-05 DIAGNOSIS — R5383 Other fatigue: Secondary | ICD-10-CM | POA: Diagnosis not present

## 2012-05-05 DIAGNOSIS — R209 Unspecified disturbances of skin sensation: Secondary | ICD-10-CM | POA: Diagnosis not present

## 2012-05-05 DIAGNOSIS — R531 Weakness: Secondary | ICD-10-CM

## 2012-05-05 MED ORDER — GADOBENATE DIMEGLUMINE 529 MG/ML IV SOLN
15.0000 mL | Freq: Once | INTRAVENOUS | Status: AC | PRN
  Administered 2012-05-05: 15 mL via INTRAVENOUS

## 2012-05-22 ENCOUNTER — Ambulatory Visit (INDEPENDENT_AMBULATORY_CARE_PROVIDER_SITE_OTHER): Payer: Medicare Other | Admitting: Surgery

## 2012-05-22 ENCOUNTER — Encounter (INDEPENDENT_AMBULATORY_CARE_PROVIDER_SITE_OTHER): Payer: Self-pay | Admitting: Surgery

## 2012-05-22 VITALS — BP 112/80 | HR 80 | Temp 98.5°F | Resp 18 | Ht 67.0 in | Wt 165.8 lb

## 2012-05-22 DIAGNOSIS — K6289 Other specified diseases of anus and rectum: Secondary | ICD-10-CM | POA: Diagnosis not present

## 2012-05-22 HISTORY — DX: Other specified diseases of anus and rectum: K62.89

## 2012-05-22 MED ORDER — DOXYCYCLINE HYCLATE 100 MG PO TABS: 100.0000 mg | ORAL_TABLET | Freq: Two times a day (BID) | ORAL | Status: DC

## 2012-05-22 NOTE — Progress Notes (Signed)
Subjective:     Patient ID: Brittany Vasquez, female   DOB: [birth year], 32 y.o.   MRN: [id number]  HPI  Brittany Vasquez  [name]  Patient Care Team: Henrine Screws, MD as PCP - General (Family Medicine) Cammie Fulp as Consulting Physician (Family Medicine)  This patient is a 32 y.o.female who presents today for surgical evaluation at the request of Dr. Jillyn Hidden.   Reason for visit: Painful lump on buttock.  Concern abscess.  Pleasant healthy female.  No diabetic.  Nonsmoker.  Noticed a painful lump on the left front area around the anus.  It has been there for least six weeks.  No change in size.  Has been given Keflex for a few times.  No improvement.  Has never drained.  Has not swollen up.  Regular bowel movements.  No history of constipation or.  His bowel movements.  Underwent C-section in October 2013.  It was uneventful.  No skin infections.  No history of MRSA.  She does shave buttocks regularly.  Wonders if that is related to that.  She notes her father has a history of cysts having to be removed off his body.  She presented this to her primary care physician.  Sent to me for evaluation.  Also has an episode of weakness and fall.  Being seen by neurology.  Question of TIA.  MRI of brain negative.  No symptoms since that time.  Otherwise very active.  Patient Active Problem List  Diagnosis  . Vaginal bleeding in pregnancy  . Non-reassuring electronic fetal monitoring tracing  . Non-reassuring fetal heart tones, delivered, current hospitalization    Past Medical History  Diagnosis Date  . No pertinent past medical history     Past Surgical History  Procedure Laterality Date  . No past surgeries    . Cesarean section  01/27/2012    Procedure: CESAREAN SECTION;  Surgeon: Geryl Rankins, MD;  Location: [facility];  Service: Obstetrics;  Laterality: N/A;  Primary Cesarean Section with birth of baby boy @ 21    History   Social History  . Marital Status:  Single    Spouse Name: N/A    Number of Children: N/A  . Years of Education: N/A   Occupational History  . Not on file.   Social History Main Topics  . Smoking status: Never Smoker   . Smokeless tobacco: Never Used  . Alcohol Use: No  . Drug Use: No  . Sexually Active: Yes    Birth Control/ Protection: None   Other Topics Concern  . Not on file   Social History Narrative  . No narrative on file    Family History  Problem Relation Age of Onset  . Cancer Mother   . Cancer Maternal Aunt     brain ca    Current Outpatient Prescriptions  Medication Sig Dispense Refill  . Fe Fum-FePoly-FA-Vit C-Vit B3 (INTEGRA F) 125-1 MG CAPS Take 1 tablet by mouth every morning.      . Prenatal Vit-Fe Fumarate-FA (PRENATAL MULTIVITAMIN) TABS Take 1 tablet by mouth every morning.       No current facility-administered medications for this visit.     No Known Allergies  BP 112/80  Pulse 80  Temp(Src) 98.5 F (36.9 C)  Resp 18  Ht 5\' 7"  (1.702 m)  Wt 165 lb 12.8 oz (75.206 kg)  BMI 25.96 kg/m2  Mr Laqueta Jean Wo Contrast  05/06/2012  This examination was performed at [facility] at  [address] [employer]. The interpretation will be provided by [facility]   Original Report Authenticated By: Marin Roberts, M.D.    Gna Rad Results  05/17/2012  Ordered by an unspecified provider.     Review of Systems  Constitutional: Negative for fever, chills, diaphoresis, appetite change and fatigue.  HENT: Negative for ear pain, sore throat, trouble swallowing, neck pain and ear discharge.   Eyes: Negative for photophobia, discharge and visual disturbance.  Respiratory: Negative for cough, choking, chest tightness and shortness of breath.   Cardiovascular: Negative for chest pain and palpitations.  Gastrointestinal: Negative for nausea, vomiting, abdominal pain, diarrhea, constipation, anal bleeding and rectal pain.  Genitourinary: Negative for dysuria,  frequency and difficulty urinating.  Musculoskeletal: Negative for myalgias and gait problem.  Skin: Negative for color change, pallor and rash.  Neurological: Negative for dizziness, speech difficulty, weakness and numbness.  Hematological: Negative for adenopathy.  Psychiatric/Behavioral: Negative for confusion and agitation. The patient is not nervous/anxious.        Objective:   Physical Exam  Constitutional: She is oriented to person, place, and time. She appears well-developed and well-nourished. No distress.  HENT:  Head: Normocephalic.  Mouth/Throat: Oropharynx is clear and moist. No oropharyngeal exudate.  Eyes: Conjunctivae and EOM are normal. Pupils are equal, round, and reactive to light. No scleral icterus.  Neck: Normal range of motion. Neck supple. No tracheal deviation present.  Cardiovascular: Normal rate, regular rhythm and intact distal pulses.   Pulmonary/Chest: Effort normal and breath sounds normal. No respiratory distress. She exhibits no tenderness.  Abdominal: Soft. She exhibits no distension and no mass. There is no tenderness. Hernia confirmed negative in the right inguinal area and confirmed negative in the left inguinal area.  Genitourinary: No vaginal discharge found.  Exam done with assistance of female Medical Assistant in the room. Perianal skin clean with good hygiene.  No pruritis.  No external skin tags / hemorrhoids of significance.  No pilonidal disease.  No fissure.  No abscess/fistula.  Tolerates digital rectal exam.  Normal sphincter tone.  No rectal masses.  Hemorrhoidal piles WNL  Painful 1.5x1cm nodule in SQ.  Left anterior 4cm from anus  No fluctuance/sinucs/drainage  Musculoskeletal: Normal range of motion. She exhibits no tenderness.  Lymphadenopathy:    She has no cervical adenopathy.       Right: No inguinal adenopathy present.       Left: No inguinal adenopathy present.  Neurological: She is alert and oriented to person, place, and time.  No cranial nerve deficit. She exhibits normal muscle tone. Coordination normal.  Skin: Skin is warm and dry. No rash noted. She is not diaphoretic. No erythema.  Psychiatric: She has a normal mood and affect. Her behavior is normal. Judgment and thought content normal.       Assessment:     Painful subcutaneous mass perirectal.  Probably inflamed cyst after shaving.  Not consistent with perirectal fistula.  Chronic.  No strong evidence of acute infection.    Plan:     Switch to doxycycline twice a day for two weeks.  Stopped using sharp razor.  Switch to Cooper's or electric razor to avoid anymore skin next.  If area resolves, followup when necessary.  If it does not or worsens, set up time for elective resection of the area:  The pathophysiology of skin & subcutaneous masses was discussed.  Natural history risks without surgery were discussed.  I recommended surgery to remove the mass.  I explained the  technique of removal with use of local anesthesia & possible need for more aggressive sedation/anesthesia for patient comfort.    Risks such as bleeding, infection, heart attack, death, and other risks were discussed.  I noted a good likelihood this will help address the problem.   Possibility that this will not correct all symptoms was explained. Possibility of regrowth/recurrence of the mass was discussed.  We will work to minimize complications. Questions were answered.  The patient expresses understanding & wishes to proceed with surgery.

## 2012-05-22 NOTE — Patient Instructions (Signed)
Switch to doxycycline twice a day for two weeks.  If not better or worsens, consider time in operating room for outpatient surgery to remove mass.  Also control pain better: Managing Pain  Pain after surgery or related to activity is often due to strain/injury to muscle, tendon, nerves and/or incisions.  This pain is usually short-term and will improve in a few months.   Many people find it helpful to do the following things TOGETHER to help speed the process of healing and to get back to regular activity more quickly:  1. Avoid heavy physical activity a.  no lifting greater than 20 pounds b. Do not "push through" the pain.  Listen to your body and avoid positions and maneuvers than reproduce the pain c. Walking is okay as tolerated, but go slowly and stop when getting sore.  d. Remember: If it hurts to do it, then don't do it! 2. Take Anti-inflammatory medication  a. Take with food/snack around the clock for 1-2 weeks i. This helps the muscle and nerve tissues become less irritable and calm down faster b. Choose ONE of the following over-the-counter medications: i. Naproxen 220mg  tabs (ex. Aleve) 1-2 pills twice a day  ii. Ibuprofen 200mg  tabs (ex. Advil, Motrin) 3-4 pills with every meal and just before bedtime iii. Acetaminophen 500mg  tabs (Tylenol) 1-2 pills with every meal and just before bedtime 3. Use a Heating pad or Ice/Cold Pack a. 4-6 times a day b. May use warm bath/hottub  or showers 4. Try Gentle Massage and/or Stretching  a. at the area of pain many times a day b. stop if you feel pain - do not overdo it  Try these steps together to help you body heal faster and avoid making things get worse.  Doing just one of these things may not be enough.    If you are not getting better after two weeks or are noticing you are getting worse, contact our office for further advice; we may need to re-evaluate you & see what other things we can do to help.

## 2012-05-22 NOTE — Addendum Note (Signed)
Addended by: Ardeth Sportsman on: 05/22/2012 12:26 PM   Modules accepted: Orders

## 2012-06-15 ENCOUNTER — Ambulatory Visit: Payer: Medicare Other | Attending: Family Medicine | Admitting: Physical Therapy

## 2012-06-15 DIAGNOSIS — M545 Low back pain, unspecified: Secondary | ICD-10-CM | POA: Diagnosis not present

## 2012-06-15 DIAGNOSIS — IMO0001 Reserved for inherently not codable concepts without codable children: Secondary | ICD-10-CM | POA: Insufficient documentation

## 2012-06-26 DIAGNOSIS — L218 Other seborrheic dermatitis: Secondary | ICD-10-CM | POA: Diagnosis not present

## 2012-06-26 DIAGNOSIS — R21 Rash and other nonspecific skin eruption: Secondary | ICD-10-CM | POA: Diagnosis not present

## 2012-06-27 ENCOUNTER — Ambulatory Visit: Payer: Medicare Other | Admitting: Physical Therapy

## 2012-06-29 ENCOUNTER — Ambulatory Visit: Payer: Medicare Other | Admitting: Physical Therapy

## 2012-07-04 ENCOUNTER — Ambulatory Visit: Payer: Medicare Other | Attending: Family Medicine | Admitting: Physical Therapy

## 2012-07-04 DIAGNOSIS — M545 Low back pain, unspecified: Secondary | ICD-10-CM | POA: Insufficient documentation

## 2012-07-04 DIAGNOSIS — IMO0001 Reserved for inherently not codable concepts without codable children: Secondary | ICD-10-CM | POA: Insufficient documentation

## 2012-07-05 DIAGNOSIS — L219 Seborrheic dermatitis, unspecified: Secondary | ICD-10-CM | POA: Diagnosis not present

## 2012-07-06 ENCOUNTER — Ambulatory Visit: Payer: Medicare Other | Admitting: Physical Therapy

## 2012-07-06 DIAGNOSIS — M545 Low back pain: Secondary | ICD-10-CM | POA: Diagnosis not present

## 2012-07-06 DIAGNOSIS — IMO0001 Reserved for inherently not codable concepts without codable children: Secondary | ICD-10-CM | POA: Diagnosis not present

## 2012-07-11 ENCOUNTER — Ambulatory Visit: Payer: Medicare Other | Admitting: Physical Therapy

## 2012-07-11 DIAGNOSIS — M545 Low back pain: Secondary | ICD-10-CM | POA: Diagnosis not present

## 2012-07-11 DIAGNOSIS — IMO0001 Reserved for inherently not codable concepts without codable children: Secondary | ICD-10-CM | POA: Diagnosis not present

## 2012-07-12 ENCOUNTER — Encounter: Payer: Medicare Other | Admitting: Physical Therapy

## 2012-07-13 ENCOUNTER — Ambulatory Visit: Payer: Medicare Other | Admitting: Physical Therapy

## 2012-07-13 DIAGNOSIS — M545 Low back pain: Secondary | ICD-10-CM | POA: Diagnosis not present

## 2012-07-13 DIAGNOSIS — IMO0001 Reserved for inherently not codable concepts without codable children: Secondary | ICD-10-CM | POA: Diagnosis not present

## 2012-07-18 ENCOUNTER — Ambulatory Visit: Payer: Medicare Other | Admitting: Physical Therapy

## 2012-07-18 DIAGNOSIS — M545 Low back pain: Secondary | ICD-10-CM | POA: Diagnosis not present

## 2012-07-18 DIAGNOSIS — IMO0001 Reserved for inherently not codable concepts without codable children: Secondary | ICD-10-CM | POA: Diagnosis not present

## 2012-07-19 ENCOUNTER — Ambulatory Visit: Payer: Medicare Other

## 2012-07-19 DIAGNOSIS — M545 Low back pain: Secondary | ICD-10-CM | POA: Diagnosis not present

## 2012-07-19 DIAGNOSIS — IMO0001 Reserved for inherently not codable concepts without codable children: Secondary | ICD-10-CM | POA: Diagnosis not present

## 2012-07-20 ENCOUNTER — Ambulatory Visit: Payer: Medicare Other | Admitting: Physical Therapy

## 2012-08-08 ENCOUNTER — Encounter: Payer: Self-pay | Admitting: Nurse Practitioner

## 2012-08-08 ENCOUNTER — Ambulatory Visit (INDEPENDENT_AMBULATORY_CARE_PROVIDER_SITE_OTHER): Payer: Medicare Other | Admitting: Nurse Practitioner

## 2012-08-08 VITALS — BP 107/65 | HR 60 | Ht 67.0 in | Wt 164.0 lb

## 2012-08-08 DIAGNOSIS — R5381 Other malaise: Secondary | ICD-10-CM | POA: Insufficient documentation

## 2012-08-08 DIAGNOSIS — R5383 Other fatigue: Secondary | ICD-10-CM

## 2012-08-08 DIAGNOSIS — R209 Unspecified disturbances of skin sensation: Secondary | ICD-10-CM | POA: Diagnosis not present

## 2012-08-08 NOTE — Progress Notes (Signed)
HPI: Patient returns for followup after initial visit 04/28/2012 with Dr. Terrace Arabia. Patient has an isolated incident of acute onset right-sided numbness, involving her right face, arm, flank ,right leg associated with weakness, she also had some blurry vision, transient confusion but no loss of consciousness. Symptoms lasted about 10 minutes and then she returned to normal. She denied headache, she only has a history of migraine. MRI of the brain was normal.   ROS:  - fatigue, chest pain,palpatations, murmur, joint pain, aching muscles, headache, not enough sleep  Physical Exam General: well developed, well nourished, seated, in no evident distress Head: head normocephalic and atraumatic. Oropharynx benign Neck: supple with no carotid or supraclavicular bruits Cardiovascular: regular rate and rhythm, no murmurs  Neurologic Exam Mental Status: Awake and fully alert. Oriented to place and time. Recent and remote memory intact. Attention span, concentration and fund of knowledge appropriate. Mood and affect appropriate.  Cranial Nerves: Fundoscopic exam reveals sharp disc margins. Pupils equal, briskly reactive to light. Extraocular movements full without nystagmus. Visual fields full to confrontation. Hearing intact and symmetric to finger snap. Facial sensation intact. Face, tongue, palate move normally and symmetrically. Neck flexion and extension normal.  Motor: Normal bulk and tone. Normal strength in all tested extremity muscles. Sensory.: intact to touch and pinprick and vibratory.  Coordination: Rapid alternating movements normal in all extremities. Finger-to-nose and heel-to-shin performed accurately bilaterally. Gait and Station: Arises from chair without difficulty. Stance is normal. Gait demonstrates normal stride length and balance . Able to heel, toe and tandem walk without difficulty.  Reflexes: 2+ and symmetric. Toes downgoing.     ASSESSMENT: One episode of acute right-sided  paresthesias weakness with normal neurologic exam. MRI of the brain was normal     PLAN: Call for any further episodes Given a copy of the MRI of the brain Followup at least yearly   Nilda Riggs, GNP-BC APRN

## 2012-08-08 NOTE — Patient Instructions (Addendum)
No further episodes of right-sided numbness involving the face arm and right leg MRI of the brain was normal, copy given to the patient Followup yearly

## 2012-11-16 DIAGNOSIS — N644 Mastodynia: Secondary | ICD-10-CM | POA: Diagnosis not present

## 2012-11-16 DIAGNOSIS — Z1231 Encounter for screening mammogram for malignant neoplasm of breast: Secondary | ICD-10-CM | POA: Diagnosis not present

## 2012-11-17 ENCOUNTER — Telehealth: Payer: Self-pay | Admitting: Genetic Counselor

## 2012-11-17 NOTE — Telephone Encounter (Signed)
S/W PT IN RE TO GENETIC APPT 10/13 @ 1 W/KAREN POWELL WELCOME PACKET MAILED.

## 2013-01-15 ENCOUNTER — Other Ambulatory Visit: Payer: Medicare Other | Admitting: Lab

## 2013-01-15 ENCOUNTER — Encounter: Payer: Medicare Other | Admitting: Genetic Counselor

## 2013-02-19 ENCOUNTER — Other Ambulatory Visit: Payer: Self-pay | Admitting: Family

## 2013-02-19 ENCOUNTER — Ambulatory Visit
Admission: RE | Admit: 2013-02-19 | Discharge: 2013-02-19 | Disposition: A | Discharge status: Home / Self Care | Payer: Medicare Other | Source: Ambulatory Visit | Attending: Family | Admitting: Family

## 2013-02-19 DIAGNOSIS — M546 Pain in thoracic spine: Secondary | ICD-10-CM

## 2013-02-19 DIAGNOSIS — M25519 Pain in unspecified shoulder: Secondary | ICD-10-CM | POA: Diagnosis not present

## 2013-03-06 DIAGNOSIS — M546 Pain in thoracic spine: Secondary | ICD-10-CM | POA: Diagnosis not present

## 2013-03-13 DIAGNOSIS — M546 Pain in thoracic spine: Secondary | ICD-10-CM | POA: Diagnosis not present

## 2013-03-15 DIAGNOSIS — M546 Pain in thoracic spine: Secondary | ICD-10-CM | POA: Diagnosis not present

## 2013-03-20 DIAGNOSIS — M546 Pain in thoracic spine: Secondary | ICD-10-CM | POA: Diagnosis not present

## 2013-03-22 DIAGNOSIS — M546 Pain in thoracic spine: Secondary | ICD-10-CM | POA: Diagnosis not present

## 2013-03-26 DIAGNOSIS — IMO0001 Reserved for inherently not codable concepts without codable children: Secondary | ICD-10-CM | POA: Diagnosis not present

## 2013-03-26 DIAGNOSIS — R5381 Other malaise: Secondary | ICD-10-CM | POA: Diagnosis not present

## 2013-03-26 DIAGNOSIS — M2550 Pain in unspecified joint: Secondary | ICD-10-CM | POA: Diagnosis not present

## 2013-03-26 DIAGNOSIS — R894 Abnormal immunological findings in specimens from other organs, systems and tissues: Secondary | ICD-10-CM | POA: Diagnosis not present

## 2013-03-28 DIAGNOSIS — M2550 Pain in unspecified joint: Secondary | ICD-10-CM | POA: Diagnosis not present

## 2013-03-28 DIAGNOSIS — M79609 Pain in unspecified limb: Secondary | ICD-10-CM | POA: Diagnosis not present

## 2013-03-28 DIAGNOSIS — R5381 Other malaise: Secondary | ICD-10-CM | POA: Diagnosis not present

## 2013-04-03 DIAGNOSIS — M546 Pain in thoracic spine: Secondary | ICD-10-CM | POA: Diagnosis not present

## 2013-04-09 ENCOUNTER — Other Ambulatory Visit: Payer: Medicare Other

## 2013-04-09 ENCOUNTER — Encounter: Payer: Self-pay | Admitting: Genetic Counselor

## 2013-04-09 ENCOUNTER — Ambulatory Visit (HOSPITAL_BASED_OUTPATIENT_CLINIC_OR_DEPARTMENT_OTHER): Payer: Medicare Other | Admitting: Genetic Counselor

## 2013-04-09 DIAGNOSIS — Z803 Family history of malignant neoplasm of breast: Secondary | ICD-10-CM | POA: Diagnosis not present

## 2013-04-09 DIAGNOSIS — Z808 Family history of malignant neoplasm of other organs or systems: Secondary | ICD-10-CM

## 2013-04-09 DIAGNOSIS — IMO0002 Reserved for concepts with insufficient information to code with codable children: Secondary | ICD-10-CM | POA: Diagnosis not present

## 2013-04-09 NOTE — Progress Notes (Signed)
Kess Thongeum, NP requested a consultation for genetic counseling and risk assessment for Brittany Vasquez, a 33 y.o. female, for discussion of her family history of breast and brain cancer.  She presents to clinic today to discuss the possibility of a genetic predisposition to cancer, and to further clarify her risks, as well as her family members' risks for cancer.   HISTORY OF PRESENT ILLNESS: Brittany Vasquez is a 33 y.o. female with no personal history of cancer.  She has a personal history of GI problems and fibromyalgia.  Past Medical History  Diagnosis Date  . No pertinent past medical history   . Fibromyalgia     Past Surgical History  Procedure Laterality Date  . No past surgeries    . Cesarean section  01/27/2012    Procedure: CESAREAN SECTION;  Surgeon: Thurnell Lose, MD;  Location: [place] [facility];  Service: Obstetrics;  Laterality: N/A;  Primary Cesarean Section with birth of baby boy @ 17    History   Social History  . Marital Status: Single    Spouse Name: N/A    Number of Children: 2  . Years of Education: N/A   Social History Main Topics  . Smoking status: Never Smoker   . Smokeless tobacco: Never Used  . Alcohol Use: No  . Drug Use: No  . Sexual Activity: Yes    Birth Control/ Protection: None   Other Topics Concern  . None   Social History Narrative   Patient lives at home with her Fiance. Patient has a B.S. Degree and has 2 children. Patient denies any tobacco, alcohol, and illicit drug use.     REPRODUCTIVE HISTORY AND PERSONAL RISK ASSESSMENT FACTORS: Menarche was at age 57.   premenopausal Uterus Intact: yes Ovaries Intact: yes G2P2A0, first live birth at age 21  She has not previously undergone treatment for infertility.   Oral Contraceptive use: 2 years   She has not used HRT in the past.    FAMILY HISTORY:  We obtained a detailed, 4-generation family history.  Significant diagnoses are listed below: Family History  Problem Relation  Age of Onset  . Breast cancer Mother 40    breast cancer returned again at age 50  . Brain cancer Maternal Aunt 39  . Breast cancer Maternal Grandmother 80  . Stroke Paternal Grandfather     Patient's maternal ancestors are of [place] American descent, and paternal ancestors are of [place], [place] and Caucasian descent. There is no reported Ashkenazi Jewish ancestry. There is no known consanguinity.  GENETIC COUNSELING ASSESSMENT: Brittany Vasquez is a 33 y.o. female with a family history of breast and brain cancer which somewhat suggestive of a hereditary cancer syndrome and predisposition to cancer. We, therefore, discussed and recommended the following at today's visit.   DISCUSSION: We reviewed the characteristics, features and inheritance patterns of hereditary cancer syndromes. We also discussed genetic testing, including the appropriate family members to test, the process of testing, insurance coverage and turn-around-time for results. We reviewed hereditary cancer syndromes.  Based on her family history, her mother is the best person in the family to perform testing on, as she developed breast cancer at ages 87 and 84.  We discussed that she does meet the NCCN guidelines for genetic testing, but that she does not meet the criteria that Medicare puts in place for genetic testing.  Based on her mother's diagnosis of breast cancer, we do recommend that she start her screenings 10 years  earlier than her mother's diagnosis, which means that she could start having mammograms now.  In order to estimate her chance of having a BRCA mutation, we used statistical models (Penn II, Myriad risk calculator and [employer]) and laboratory data that take into account her personal medical history, family history and ancestry.  Because each model is different, there can be a lot of variability in the risks they give.  Therefore, these numbers must be considered a rough range and not a precise  risk of having a BRCA mutation.  These models estimate that she has approximately a 5.5-8.0% chance of having a mutation. Based on this assessment of her family and personal history, genetic testing is recommended.  Based on the patient's personal and family history, statistical models (Tyrer Cusik)  and literature data were used to estimate her risk of developing breast cancer. These estimate her lifetime risk of developing breast cancer to be approximately 33%. This estimation does not take into account any genetic testing results.  The patient's lifetime breast cancer risk is a preliminary estimate based on available information using one of several models endorsed by the [place] (ACS). The ACS recommends consideration of breast MRI screening as an adjunct to mammography for patients at high risk (defined as 20% or greater lifetime risk). A more detailed breast cancer risk assessment can be considered, if clinically indicated.   PLAN: After considering the risks, benefits, and limitations, Brittany Vasquez declined testing, but will discuss with her mother about genetic testing for her. We discussed the implications of a positive, negative and/ or variant of uncertain significance genetic test result. We encouraged Brittany Vasquez to remain in contact with cancer genetics annually so that we can continuously update the family history and inform her of any changes in cancer genetics and testing that may be of benefit for her family. Brittany Vasquez questions were answered to her satisfaction today. Our contact information was provided should additional questions or concerns arise.  The patient was seen for a total of 45 minutes, greater than 50% of which was spent face-to-face counseling.  This note will also be sent to the referring provider via the electronic medical record. The patient will be supplied with a summary of this genetic counseling discussion as well as educational  information on the discussed hereditary cancer syndromes following the conclusion of their visit.   Patient was discussed with Dr. Marcy Panning.   _______________________________________________________________________ For Office Staff:  Number of people involved in session: 1 Was an Intern/ student involved with case: yes

## 2013-04-10 DIAGNOSIS — M546 Pain in thoracic spine: Secondary | ICD-10-CM | POA: Diagnosis not present

## 2013-04-16 DIAGNOSIS — M2550 Pain in unspecified joint: Secondary | ICD-10-CM | POA: Diagnosis not present

## 2013-04-16 DIAGNOSIS — R894 Abnormal immunological findings in specimens from other organs, systems and tissues: Secondary | ICD-10-CM | POA: Diagnosis not present

## 2013-04-16 DIAGNOSIS — IMO0001 Reserved for inherently not codable concepts without codable children: Secondary | ICD-10-CM | POA: Diagnosis not present

## 2013-04-16 DIAGNOSIS — R5381 Other malaise: Secondary | ICD-10-CM | POA: Diagnosis not present

## 2013-04-17 DIAGNOSIS — M546 Pain in thoracic spine: Secondary | ICD-10-CM | POA: Diagnosis not present

## 2013-05-08 DIAGNOSIS — M546 Pain in thoracic spine: Secondary | ICD-10-CM | POA: Diagnosis not present

## 2013-05-30 DIAGNOSIS — N926 Irregular menstruation, unspecified: Secondary | ICD-10-CM | POA: Diagnosis not present

## 2013-05-30 DIAGNOSIS — N939 Abnormal uterine and vaginal bleeding, unspecified: Secondary | ICD-10-CM | POA: Diagnosis not present

## 2013-06-06 DIAGNOSIS — L219 Seborrheic dermatitis, unspecified: Secondary | ICD-10-CM | POA: Diagnosis not present

## 2013-08-08 ENCOUNTER — Ambulatory Visit: Payer: Medicare Other | Admitting: Nurse Practitioner

## 2013-12-27 DIAGNOSIS — M25539 Pain in unspecified wrist: Secondary | ICD-10-CM | POA: Diagnosis not present

## 2013-12-28 DIAGNOSIS — M25539 Pain in unspecified wrist: Secondary | ICD-10-CM | POA: Diagnosis not present

## 2014-01-01 DIAGNOSIS — R894 Abnormal immunological findings in specimens from other organs, systems and tissues: Secondary | ICD-10-CM | POA: Diagnosis not present

## 2014-01-16 DIAGNOSIS — G5602 Carpal tunnel syndrome, left upper limb: Secondary | ICD-10-CM | POA: Diagnosis not present

## 2014-01-16 DIAGNOSIS — G5622 Lesion of ulnar nerve, left upper limb: Secondary | ICD-10-CM | POA: Diagnosis not present

## 2014-01-16 DIAGNOSIS — G5621 Lesion of ulnar nerve, right upper limb: Secondary | ICD-10-CM | POA: Diagnosis not present

## 2014-01-16 DIAGNOSIS — G5601 Carpal tunnel syndrome, right upper limb: Secondary | ICD-10-CM | POA: Diagnosis not present

## 2014-01-21 DIAGNOSIS — M79641 Pain in right hand: Secondary | ICD-10-CM | POA: Diagnosis not present

## 2014-01-21 DIAGNOSIS — G5621 Lesion of ulnar nerve, right upper limb: Secondary | ICD-10-CM | POA: Diagnosis not present

## 2014-01-21 DIAGNOSIS — G5601 Carpal tunnel syndrome, right upper limb: Secondary | ICD-10-CM | POA: Diagnosis not present

## 2014-01-21 DIAGNOSIS — G5602 Carpal tunnel syndrome, left upper limb: Secondary | ICD-10-CM | POA: Diagnosis not present

## 2014-01-21 DIAGNOSIS — M79642 Pain in left hand: Secondary | ICD-10-CM | POA: Diagnosis not present

## 2014-02-04 ENCOUNTER — Encounter: Payer: Self-pay | Admitting: Genetic Counselor

## 2014-02-11 DIAGNOSIS — M797 Fibromyalgia: Secondary | ICD-10-CM | POA: Diagnosis not present

## 2014-02-14 DIAGNOSIS — N644 Mastodynia: Secondary | ICD-10-CM | POA: Diagnosis not present

## 2014-02-19 DIAGNOSIS — Z803 Family history of malignant neoplasm of breast: Secondary | ICD-10-CM | POA: Diagnosis not present

## 2014-02-19 DIAGNOSIS — N63 Unspecified lump in breast: Secondary | ICD-10-CM | POA: Diagnosis not present

## 2014-02-22 DIAGNOSIS — M797 Fibromyalgia: Secondary | ICD-10-CM | POA: Diagnosis not present

## 2014-02-22 DIAGNOSIS — R768 Other specified abnormal immunological findings in serum: Secondary | ICD-10-CM | POA: Diagnosis not present

## 2014-02-22 DIAGNOSIS — M545 Low back pain: Secondary | ICD-10-CM | POA: Diagnosis not present

## 2014-05-14 DIAGNOSIS — R21 Rash and other nonspecific skin eruption: Secondary | ICD-10-CM | POA: Diagnosis not present

## 2014-06-06 DIAGNOSIS — L219 Seborrheic dermatitis, unspecified: Secondary | ICD-10-CM | POA: Diagnosis not present

## 2015-02-10 ENCOUNTER — Other Ambulatory Visit: Payer: Self-pay

## 2015-03-13 DIAGNOSIS — Z803 Family history of malignant neoplasm of breast: Secondary | ICD-10-CM | POA: Diagnosis not present

## 2015-03-13 DIAGNOSIS — Z1231 Encounter for screening mammogram for malignant neoplasm of breast: Secondary | ICD-10-CM | POA: Diagnosis not present

## 2015-03-18 DIAGNOSIS — Z1231 Encounter for screening mammogram for malignant neoplasm of breast: Secondary | ICD-10-CM | POA: Diagnosis not present

## 2015-03-18 DIAGNOSIS — Z803 Family history of malignant neoplasm of breast: Secondary | ICD-10-CM | POA: Diagnosis not present

## 2015-03-18 DIAGNOSIS — N6489 Other specified disorders of breast: Secondary | ICD-10-CM | POA: Diagnosis not present

## 2015-11-28 ENCOUNTER — Inpatient Hospital Stay (HOSPITAL_COMMUNITY)
Admission: AD | Admit: 2015-11-28 | Discharge: 2015-11-29 | Disposition: A | Discharge status: Home / Self Care | Payer: Non-veteran care | Source: Ambulatory Visit | Attending: Obstetrics & Gynecology | Admitting: Obstetrics & Gynecology

## 2015-11-28 ENCOUNTER — Telehealth: Payer: Self-pay | Admitting: *Deleted

## 2015-11-28 ENCOUNTER — Encounter (HOSPITAL_COMMUNITY): Payer: Self-pay | Admitting: *Deleted

## 2015-11-28 DIAGNOSIS — Z9889 Other specified postprocedural states: Secondary | ICD-10-CM | POA: Insufficient documentation

## 2015-11-28 DIAGNOSIS — Z79899 Other long term (current) drug therapy: Secondary | ICD-10-CM | POA: Diagnosis not present

## 2015-11-28 DIAGNOSIS — O26899 Other specified pregnancy related conditions, unspecified trimester: Secondary | ICD-10-CM

## 2015-11-28 DIAGNOSIS — O210 Mild hyperemesis gravidarum: Secondary | ICD-10-CM | POA: Insufficient documentation

## 2015-11-28 DIAGNOSIS — O99611 Diseases of the digestive system complicating pregnancy, first trimester: Secondary | ICD-10-CM | POA: Diagnosis not present

## 2015-11-28 DIAGNOSIS — K117 Disturbances of salivary secretion: Secondary | ICD-10-CM

## 2015-11-28 DIAGNOSIS — Z3A01 Less than 8 weeks gestation of pregnancy: Secondary | ICD-10-CM | POA: Diagnosis not present

## 2015-11-28 DIAGNOSIS — M797 Fibromyalgia: Secondary | ICD-10-CM | POA: Diagnosis not present

## 2015-11-28 DIAGNOSIS — R110 Nausea: Secondary | ICD-10-CM

## 2015-11-28 HISTORY — DX: Ulcerative colitis, unspecified, without complications: K51.90

## 2015-11-28 LAB — COMPREHENSIVE METABOLIC PANEL
ALBUMIN: 3.7 g/dL (ref 3.5–5.0)
ALK PHOS: 49 U/L (ref 38–126)
ALT: 10 U/L — ABNORMAL LOW (ref 14–54)
ANION GAP: 5 (ref 5–15)
AST: 17 U/L (ref 15–41)
BILIRUBIN TOTAL: 0.3 mg/dL (ref 0.3–1.2)
BUN: 11 mg/dL (ref 6–20)
CALCIUM: 9.2 mg/dL (ref 8.9–10.3)
CO2: 25 mmol/L (ref 22–32)
Chloride: 105 mmol/L (ref 101–111)
Creatinine, Ser: 0.7 mg/dL (ref 0.44–1.00)
Glucose, Bld: 130 mg/dL — ABNORMAL HIGH (ref 65–99)
POTASSIUM: 4.2 mmol/L (ref 3.5–5.1)
Sodium: 135 mmol/L (ref 135–145)
TOTAL PROTEIN: 7.2 g/dL (ref 6.5–8.1)

## 2015-11-28 LAB — CBC
HEMATOCRIT: 35.7 % — AB (ref 36.0–46.0)
Hemoglobin: 12.1 g/dL (ref 12.0–15.0)
MCH: 27.4 pg (ref 26.0–34.0)
MCHC: 33.9 g/dL (ref 30.0–36.0)
MCV: 80.8 fL (ref 78.0–100.0)
Platelets: 159 10*3/uL (ref 150–400)
RBC: 4.42 MIL/uL (ref 3.87–5.11)
RDW: 13.2 % (ref 11.5–15.5)
WBC: 7.5 10*3/uL (ref 4.0–10.5)

## 2015-11-28 LAB — URINALYSIS, ROUTINE W REFLEX MICROSCOPIC
BILIRUBIN URINE: NEGATIVE
Glucose, UA: NEGATIVE mg/dL
HGB URINE DIPSTICK: NEGATIVE
KETONES UR: NEGATIVE mg/dL
Leukocytes, UA: NEGATIVE
NITRITE: NEGATIVE
PH: 5.5 (ref 5.0–8.0)
Protein, ur: NEGATIVE mg/dL

## 2015-11-28 LAB — POCT PREGNANCY, URINE: Preg Test, Ur: POSITIVE — AB

## 2015-11-28 MED ORDER — PROMETHAZINE HCL 25 MG PO TABS
25.0000 mg | ORAL_TABLET | Freq: Four times a day (QID) | ORAL | Status: DC | PRN
  Administered 2015-11-28: 25 mg via ORAL
  Filled 2015-11-28: qty 1

## 2015-11-28 MED ORDER — FAMOTIDINE 20 MG PO TABS
10.0000 mg | ORAL_TABLET | Freq: Every day | ORAL | Status: DC
  Administered 2015-11-28: 10 mg via ORAL
  Filled 2015-11-28: qty 1

## 2015-11-28 MED ORDER — GLYCOPYRROLATE 1 MG PO TABS
1.0000 mg | ORAL_TABLET | Freq: Three times a day (TID) | ORAL | Status: DC
  Administered 2015-11-28: 1 mg via ORAL
  Filled 2015-11-28 (×2): qty 1

## 2015-11-28 NOTE — MAU Note (Signed)
Extreme nausea for a wk with dry heaves. Diarrhea for a wk. Denies vag bleeding or d/c. No pain.

## 2015-11-28 NOTE — Telephone Encounter (Signed)
Brittany Vasquez left a message she has not yet had her first ob appt yet but having extreme nausea and she wondered if we could sent her in a prescription.     I discussed with Dr. Roselie Awkward and since we have never seen patient in our clinic or MAU we can not prescribe a medicine. Will need to come to MAU if we can not get in clinic.

## 2015-11-28 NOTE — MAU Provider Note (Signed)
History     CSN: [id number]  Arrival date and time: 11/28/15 2155   None     Chief Complaint  Patient presents with  . Morning Sickness  . Diarrhea   G3P2002 @[redacted]w[redacted]d  by LMP c/o nausea and dry heaving x5 days. She also reports excess saliva and spitting numerous times throughout the day. She is tolerating food and fluids w/o problem. She denies fevers. She reports loose stools x1 week and has hx of ulcerative colitis. She denies pain or VB. She attempted to call the clinic for nausea med Rx but was told needed to be seen first. Her first prenatal visit is scheduled for end of September.   OB History    Gravida Para Term Preterm AB Living   3 2 2  0 0 2   SAB TAB Ectopic Multiple Live Births   0 0 0 0 1      Past Medical History:  Diagnosis Date  . Fibromyalgia   . No pertinent past medical history   . Ulcerative colitis [facility])     Past Surgical History:  Procedure Laterality Date  . CESAREAN SECTION  01/27/2012   Procedure: CESAREAN SECTION;  Surgeon: Thurnell Lose, MD;  Location: [place] [facility];  Service: Obstetrics;  Laterality: N/A;  Primary Cesarean Section with birth of baby boy @ 26    Family History  Problem Relation Age of Onset  . Breast cancer Mother 76    breast cancer returned again at age 14  . Brain cancer Maternal Aunt 39  . Breast cancer Maternal Grandmother 80  . Stroke Paternal Grandfather     Social History  Substance Use Topics  . Smoking status: Never Smoker  . Smokeless tobacco: Never Used  . Alcohol use No    Allergies: No Known Allergies  Prescriptions Prior to Admission  Medication Sig Dispense Refill Last Dose  . Cholecalciferol (VITAMIN D) 2000 UNITS CAPS Take 2,000 each by mouth daily.   11/28/2015 at Unknown time  . Prenatal Vit-Fe Fumarate-FA (PRENATAL MULTIVITAMIN) TABS Take 1 tablet by mouth every morning.   Past Week at Unknown time  . Fe Fum-FePoly-FA-Vit C-Vit B3 (INTEGRA F) 125-1 MG CAPS Take 1 tablet by mouth every morning.    Taking  . IBUPROFEN PO Take by mouth.   Taking    Review of Systems  Constitutional: Negative.   Gastrointestinal: Positive for nausea. Negative for abdominal pain, constipation, diarrhea and vomiting.   Physical Exam   Blood pressure 120/77, pulse 68, temperature 98.1 F (36.7 C), height 5\' 7"  (1.702 m), weight 82.7 kg (182 lb 6.4 oz), last menstrual period 10/11/2015.  Physical Exam  Constitutional: She is oriented to person, place, and time. She appears well-developed and well-nourished.  HENT:  Head: Normocephalic and atraumatic.  Neck: Normal range of motion.  Cardiovascular: Normal rate.   Respiratory: Effort normal.  GI: Soft. She exhibits no distension and no mass. There is no tenderness. There is no rebound and no guarding.  Musculoskeletal: Normal range of motion.  Neurological: She is alert and oriented to person, place, and time.  Skin: Skin is warm and dry.  Psychiatric: She has a normal mood and affect.   Results for orders placed or performed during the hospital encounter of 11/28/15 (from the past 24 hour(s))  Urinalysis, Routine w reflex microscopic (not at [facility])     Status: Abnormal   Collection Time: 11/28/15 10:05 PM  Result Value Ref Range   Color, Urine YELLOW YELLOW   APPearance CLEAR CLEAR  Specific Gravity, Urine <1.005 (L) 1.005 - 1.030   pH 5.5 5.0 - 8.0   Glucose, UA NEGATIVE NEGATIVE mg/dL   Hgb urine dipstick NEGATIVE NEGATIVE   Bilirubin Urine NEGATIVE NEGATIVE   Ketones, ur NEGATIVE NEGATIVE mg/dL   Protein, ur NEGATIVE NEGATIVE mg/dL   Nitrite NEGATIVE NEGATIVE   Leukocytes, UA NEGATIVE NEGATIVE  Pregnancy, urine POC     Status: Abnormal   Collection Time: 11/28/15 10:22 PM  Result Value Ref Range   Preg Test, Ur POSITIVE (A) NEGATIVE  CBC     Status: Abnormal   Collection Time: 11/28/15 11:03 PM  Result Value Ref Range   WBC 7.5 4.0 - 10.5 K/uL   RBC 4.42 3.87 - 5.11 MIL/uL   Hemoglobin 12.1 12.0 - 15.0 g/dL   HCT 35.7 (L) 36.0 -  46.0 %   MCV 80.8 78.0 - 100.0 fL   MCH 27.4 26.0 - 34.0 pg   MCHC 33.9 30.0 - 36.0 g/dL   RDW 13.2 11.5 - 15.5 %   Platelets 159 150 - 400 K/uL  Comprehensive metabolic panel     Status: Abnormal   Collection Time: 11/28/15 11:03 PM  Result Value Ref Range   Sodium 135 135 - 145 mmol/L   Potassium 4.2 3.5 - 5.1 mmol/L   Chloride 105 101 - 111 mmol/L   CO2 25 22 - 32 mmol/L   Glucose, Bld 130 (H) 65 - 99 mg/dL   BUN 11 6 - 20 mg/dL   Creatinine, Ser 0.70 0.44 - 1.00 mg/dL   Calcium 9.2 8.9 - 10.3 mg/dL   Total Protein 7.2 6.5 - 8.1 g/dL   Albumin 3.7 3.5 - 5.0 g/dL   AST 17 15 - 41 U/L   ALT 10 (L) 14 - 54 U/L   Alkaline Phosphatase 49 38 - 126 U/L   Total Bilirubin 0.3 0.3 - 1.2 mg/dL   GFR calc non Af Amer >60 >60 mL/min   GFR calc Af Amer >60 >60 mL/min   Anion gap 5 5 - 15    MAU Course  Procedures Pepcid 10 mg po x1 Phenergan 25 mg po x1 Robinul 1 mg po x1  MDM Labs ordered and reviewed. No evidence of dehydration. Tolerating po. No episodes of emesis. Reports reduction of nausea. Stable for discharge home.  Assessment and Plan   1. Ptyalism   2. Pregnancy related nausea, antepartum    Discharge home Robinul 1 mg po TID prn Phenergan 25 mg po q6 hrs prn Zantac 150 mg po daily (OTC) Follow up as scheduled in [place] [facility] for worsening sx  Julianne Handler, CNM 11/28/2015, 11:10 PM

## 2015-11-28 NOTE — Telephone Encounter (Signed)
I called Brittany Vasquez back and we discussed we can not prescribe her any medicines without seeing her. I encouraged her to go to [facility] which is open 24/7. She denies dizziness/ weakness, just having nausea only. She voices understanding.

## 2015-11-29 DIAGNOSIS — K117 Disturbances of salivary secretion: Secondary | ICD-10-CM | POA: Diagnosis not present

## 2015-11-29 DIAGNOSIS — O210 Mild hyperemesis gravidarum: Secondary | ICD-10-CM

## 2015-11-29 MED ORDER — PROMETHAZINE HCL 25 MG PO TABS: 25.0000 mg | ORAL_TABLET | Freq: Four times a day (QID) | ORAL | 1 refills | Status: DC | PRN

## 2015-11-29 MED ORDER — GLYCOPYRROLATE 1 MG PO TABS: 1.0000 mg | ORAL_TABLET | Freq: Three times a day (TID) | ORAL | 1 refills | Status: DC

## 2015-11-29 NOTE — Progress Notes (Signed)
Melanie Bhambri CNM in earlier to discuss test results and d/c plan. Written and verbal d/c instructions given and understanding voiced 

## 2015-11-29 NOTE — Discharge Instructions (Signed)

## 2015-12-30 ENCOUNTER — Other Ambulatory Visit: Payer: Self-pay | Admitting: Advanced Practice Midwife

## 2015-12-30 ENCOUNTER — Ambulatory Visit (INDEPENDENT_AMBULATORY_CARE_PROVIDER_SITE_OTHER): Payer: Non-veteran care | Admitting: Advanced Practice Midwife

## 2015-12-30 ENCOUNTER — Encounter: Payer: Self-pay | Admitting: Advanced Practice Midwife

## 2015-12-30 ENCOUNTER — Other Ambulatory Visit (HOSPITAL_COMMUNITY)
Admission: RE | Admit: 2015-12-30 | Discharge: 2015-12-30 | Disposition: A | Discharge status: Home / Self Care | Payer: Non-veteran care | Source: Ambulatory Visit | Attending: Advanced Practice Midwife | Admitting: Advanced Practice Midwife

## 2015-12-30 VITALS — BP 121/82 | HR 111 | Wt 178.4 lb

## 2015-12-30 DIAGNOSIS — Z36 Encounter for antenatal screening of mother: Secondary | ICD-10-CM

## 2015-12-30 DIAGNOSIS — O09521 Supervision of elderly multigravida, first trimester: Secondary | ICD-10-CM

## 2015-12-30 DIAGNOSIS — O0990 Supervision of high risk pregnancy, unspecified, unspecified trimester: Secondary | ICD-10-CM

## 2015-12-30 DIAGNOSIS — Z113 Encounter for screening for infections with a predominantly sexual mode of transmission: Secondary | ICD-10-CM | POA: Diagnosis present

## 2015-12-30 DIAGNOSIS — O3680X1 Pregnancy with inconclusive fetal viability, fetus 1: Secondary | ICD-10-CM

## 2015-12-30 DIAGNOSIS — Z3491 Encounter for supervision of normal pregnancy, unspecified, first trimester: Secondary | ICD-10-CM

## 2015-12-30 DIAGNOSIS — O219 Vomiting of pregnancy, unspecified: Secondary | ICD-10-CM

## 2015-12-30 MED ORDER — ONDANSETRON HCL 8 MG PO TABS: 8.0000 mg | ORAL_TABLET | Freq: Three times a day (TID) | ORAL | 2 refills | Status: DC | PRN

## 2015-12-30 NOTE — Progress Notes (Signed)
  Subjective:    Brittany Vasquez is being seen today for her first obstetrical visit.  This is a planned pregnancy. She is at [redacted]w[redacted]d gestation. Her obstetrical history is significant for advanced maternal age. Patient does intend to breast feed. Pregnancy history fully reviewed.  Patient reports nausea.  Review of Systems:   Review of Systems  Constitutional: Negative for chills and fever.  Gastrointestinal: Positive for nausea and vomiting. Negative for abdominal pain.  Genitourinary: Negative for vaginal bleeding and vaginal discharge.    Objective:     BP 121/82   Pulse (!) 111   Wt 178 lb 6.4 oz (80.9 kg)   LMP 10/11/2015 (Exact Date)   BMI 27.94 kg/m  Physical Exam  Constitutional: She is oriented to person, place, and time. She appears well-developed and well-nourished.  Cardiovascular: Normal rate, regular rhythm and normal heart sounds.   Respiratory: Effort normal and breath sounds normal. No respiratory distress.  GI: Soft. There is no tenderness.  Neurological: She is alert and oriented to person, place, and time. She has normal reflexes.  Skin: Skin is warm and dry.  Psychiatric: She has a normal mood and affect.    Maternal Exam:  Abdomen: Patient reports no abdominal tenderness. Fundal height is 12 cm.       Fetal Exam Fetal Monitor Review: Mode: ultrasound.   Baseline rate: Pos FHR per [place].         Assessment:    Pregnancy: G3P2002   1. Encounter for supervision of High-risk pregnancy, antepartum, first trimester  - GC/Chlamydia probe amp ([place])not at [facility], Profile 6 Conf w/o mM, U - Culture, OB Urine - Prenatal Profile  2. AMA (advanced maternal age) multigravida 34+, first trimester  - [place] MFM Fetal Nuchal Translucency; Future  3. Nausea and vomiting during pregnancy  - ondansetron (ZOFRAN) 8 MG tablet; Take 1 tablet (8 mg total) by mouth every 8 (eight) hours as needed for nausea or vomiting.  Dispense: 20 tablet;  Refill: 2  4. Examination to determine fetal viability of pregnancy, fetus 1  - Amniotic fluid index    Plan:     Initial labs drawn. Prenatal vitamins. Problem list reviewed and updated. First trimester screen discussed: ordered. Role of ultrasound in pregnancy discussed; fetal survey: requested. Amniocentesis discussed: declined. Follow up in 4 weeks. Discussed in [facility]. Undecided.  Manya Silvas 12/30/2015

## 2015-12-30 NOTE — Progress Notes (Signed)
Bedside US for viability = Single IUP; FHR - 168 bpm per M-mode; CRL - 4.29 cm (11w 1d); FM present

## 2015-12-30 NOTE — Progress Notes (Signed)
Last pap in December.  refuses flu shot

## 2015-12-31 ENCOUNTER — Encounter (HOSPITAL_COMMUNITY): Payer: Self-pay | Admitting: Advanced Practice Midwife

## 2015-12-31 LAB — PRENATAL PROFILE (SOLSTAS)
ANTIBODY SCREEN: NEGATIVE
BASOS ABS: 0 {cells}/uL (ref 0–200)
Basophils Relative: 0 %
EOS ABS: 76 {cells}/uL (ref 15–500)
Eosinophils Relative: 1 %
HCT: 35.9 % (ref 35.0–45.0)
HIV 1&2 Ab, 4th Generation: NONREACTIVE
Hemoglobin: 12.1 g/dL (ref 11.7–15.5)
Hepatitis B Surface Ag: NEGATIVE
Lymphocytes Relative: 19 %
Lymphs Abs: 1444 cells/uL (ref 850–3900)
MCH: 27.3 pg (ref 27.0–33.0)
MCHC: 33.7 g/dL (ref 32.0–36.0)
MCV: 81 fL (ref 80.0–100.0)
MONOS PCT: 7 %
MPV: 10.1 fL (ref 7.5–12.5)
Monocytes Absolute: 532 cells/uL (ref 200–950)
NEUTROS ABS: 5548 {cells}/uL (ref 1500–7800)
Neutrophils Relative %: 73 %
PLATELETS: 186 10*3/uL (ref 140–400)
RBC: 4.43 MIL/uL (ref 3.80–5.10)
RDW: 13.2 % (ref 11.0–15.0)
RUBELLA: 1.52 {index} — AB (ref <0.90)
Rh Type: POSITIVE
WBC: 7.6 10*3/uL (ref 3.8–10.8)

## 2015-12-31 LAB — GC/CHLAMYDIA PROBE AMP (~~LOC~~) NOT AT ARMC
Chlamydia: NEGATIVE
Neisseria Gonorrhea: NEGATIVE

## 2015-12-31 LAB — CULTURE, OB URINE
COLONY COUNT: NO GROWTH
ORGANISM ID, BACTERIA: NO GROWTH

## 2016-01-04 LAB — PAIN MGMT, PROFILE 6 CONF W/O MM, U
6 ACETYLMORPHINE: NEGATIVE ng/mL (ref <10)
AMPHETAMINES: NEGATIVE ng/mL (ref <500)
Alcohol Metabolites: NEGATIVE ng/mL (ref <500)
Barbiturates: NEGATIVE ng/mL (ref <300)
Benzodiazepines: NEGATIVE ng/mL (ref <100)
COCAINE METABOLITE: NEGATIVE ng/mL (ref <150)
CODEINE: NEGATIVE ng/mL (ref <50)
CREATININE: 193.4 mg/dL (ref >=20.0)
HYDROMORPHONE: NEGATIVE ng/mL (ref <50)
Hydrocodone: NEGATIVE ng/mL (ref <50)
Marijuana Metabolite: NEGATIVE ng/mL (ref <20)
Methadone Metabolite: NEGATIVE ng/mL (ref <100)
Morphine: NEGATIVE ng/mL (ref <50)
Norhydrocodone: NEGATIVE ng/mL (ref <50)
OPIATES: NEGATIVE ng/mL (ref <100)
OXIDANT: NEGATIVE ug/mL (ref <200)
Oxycodone: NEGATIVE ng/mL (ref <100)
PH: 7.18 (ref 4.5–9.0)
PHENCYCLIDINE: NEGATIVE ng/mL (ref <25)
Please note:: 0

## 2016-01-07 DIAGNOSIS — O09521 Supervision of elderly multigravida, first trimester: Secondary | ICD-10-CM | POA: Insufficient documentation

## 2016-01-07 NOTE — Patient Instructions (Signed)

## 2016-01-09 ENCOUNTER — Ambulatory Visit (HOSPITAL_COMMUNITY)
Admission: RE | Admit: 2016-01-09 | Discharge: 2016-01-09 | Disposition: A | Discharge status: Home / Self Care | Payer: Non-veteran care | Source: Ambulatory Visit | Attending: Advanced Practice Midwife | Admitting: Advanced Practice Midwife

## 2016-01-09 ENCOUNTER — Encounter (HOSPITAL_COMMUNITY): Payer: Self-pay

## 2016-01-09 DIAGNOSIS — Z3A12 12 weeks gestation of pregnancy: Secondary | ICD-10-CM | POA: Diagnosis not present

## 2016-01-09 DIAGNOSIS — O34219 Maternal care for unspecified type scar from previous cesarean delivery: Secondary | ICD-10-CM | POA: Diagnosis not present

## 2016-01-09 DIAGNOSIS — O09521 Supervision of elderly multigravida, first trimester: Secondary | ICD-10-CM

## 2016-01-09 DIAGNOSIS — Z3682 Encounter for antenatal screening for nuchal translucency: Secondary | ICD-10-CM | POA: Insufficient documentation

## 2016-01-09 DIAGNOSIS — O09522 Supervision of elderly multigravida, second trimester: Secondary | ICD-10-CM | POA: Insufficient documentation

## 2016-01-09 DIAGNOSIS — O0990 Supervision of high risk pregnancy, unspecified, unspecified trimester: Secondary | ICD-10-CM

## 2016-01-15 ENCOUNTER — Telehealth (HOSPITAL_COMMUNITY): Payer: Self-pay | Admitting: MS"

## 2016-01-15 NOTE — Telephone Encounter (Signed)
Called Ms. Brittany Vasquez regarding results of first trimester screening, per our previous discussion at the time of the patient's visit last week. Results are within normal range, reducing the risks for fetal Down syndrome (1 in 263 to less than 1 in 10,000) and trisomy 18/13 (1 in 478 to less than 1 in 10,000).   Brittany Vasquez 01/15/2016 10:54 AM

## 2016-01-22 ENCOUNTER — Other Ambulatory Visit (HOSPITAL_COMMUNITY): Payer: Self-pay

## 2016-01-27 ENCOUNTER — Encounter: Payer: Self-pay | Admitting: Obstetrics and Gynecology

## 2016-01-27 DIAGNOSIS — O34219 Maternal care for unspecified type scar from previous cesarean delivery: Secondary | ICD-10-CM | POA: Insufficient documentation

## 2016-01-28 ENCOUNTER — Ambulatory Visit (INDEPENDENT_AMBULATORY_CARE_PROVIDER_SITE_OTHER): Payer: Non-veteran care | Admitting: Family Medicine

## 2016-01-28 VITALS — BP 117/75 | HR 74 | Wt 182.0 lb

## 2016-01-28 DIAGNOSIS — O34219 Maternal care for unspecified type scar from previous cesarean delivery: Secondary | ICD-10-CM

## 2016-01-28 DIAGNOSIS — Z124 Encounter for screening for malignant neoplasm of cervix: Secondary | ICD-10-CM

## 2016-01-28 DIAGNOSIS — Z1151 Encounter for screening for human papillomavirus (HPV): Secondary | ICD-10-CM | POA: Diagnosis not present

## 2016-01-28 DIAGNOSIS — O09521 Supervision of elderly multigravida, first trimester: Secondary | ICD-10-CM

## 2016-01-28 DIAGNOSIS — Z113 Encounter for screening for infections with a predominantly sexual mode of transmission: Secondary | ICD-10-CM | POA: Diagnosis not present

## 2016-01-28 DIAGNOSIS — O0990 Supervision of high risk pregnancy, unspecified, unspecified trimester: Secondary | ICD-10-CM

## 2016-01-28 DIAGNOSIS — Z8742 Personal history of other diseases of the female genital tract: Secondary | ICD-10-CM

## 2016-01-28 LAB — POCT URINALYSIS DIP (DEVICE)
BILIRUBIN URINE: NEGATIVE
Glucose, UA: NEGATIVE mg/dL
HGB URINE DIPSTICK: NEGATIVE
KETONES UR: NEGATIVE mg/dL
Leukocytes, UA: NEGATIVE
Nitrite: NEGATIVE
PH: 7.5 (ref 5.0–8.0)
Protein, ur: NEGATIVE mg/dL
Specific Gravity, Urine: 1.015 (ref 1.005–1.030)
Urobilinogen, UA: 0.2 mg/dL (ref 0.0–1.0)

## 2016-01-28 NOTE — Progress Notes (Signed)
   PRENATAL VISIT NOTE  Subjective:  Brittany Vasquez is a 35 y.o. G3P2002 at [redacted]w[redacted]d being seen today for ongoing prenatal care.  She is currently monitored for the following issues for this high-risk pregnancy and has Mass of perirectal soft tissue - prob seb cyst; Disturbance of skin sensation; Supervision of high risk pregnancy, antepartum; AMA (advanced maternal age) multigravida 35+, first trimester; and Previous cesarean section complicating pregnancy on her problem list.  Patient reports regular uterine cramps at night over past 5 days. No palliating or provoking factors. Is nauseated, but still eating and drinking. Trying to keep hydrated. No vaginal bleeding..  Contractions: Irritability. Vag. Bleeding: None.   . Denies leaking of fluid.   The following portions of the patient's history were reviewed and updated as appropriate: allergies, current medications, past family history, past medical history, past social history, past surgical history and problem list. Problem list updated.  Objective:   Vitals:   01/28/16 1102  BP: 117/75  Pulse: 74  Weight: 182 lb (82.6 kg)    Fetal Status: Fetal Heart Rate (bpm): 150         General:  Alert, oriented and cooperative. Patient is in no acute distress.  Skin: Skin is warm and dry. No rash noted.   Cardiovascular: Normal heart rate noted  Respiratory: Normal respiratory effort, no problems with respiration noted  Abdomen: Soft, gravid, appropriate for gestational age. Pain/Pressure: Present     Pelvic:  Cervix closed on speculum exam. Normal thickness. PAP done. Wet prep obtained.  Extremities: Normal range of motion.  Edema: None  Mental Status: Normal mood and affect. Normal behavior. Normal judgment and thought content.   Assessment and Plan:  Pregnancy: G3P2002 at [redacted]w[redacted]d  1. Supervision of high risk pregnancy, antepartum FHT normal. Will get wet prep. UA normal - [place] MFM OB COMP + 14 WK; Future  2. AMA (advanced maternal age)  multigravida 63+, first trimester  - [place] MFM OB COMP + 7 WK; Future  3. Previous cesarean section complicating pregnancy  - [place] MFM OB COMP + 42 WK; Future  4. History of abnormal cervical Pap smear PAP done.  Preterm labor symptoms and general obstetric precautions including but not limited to vaginal bleeding, contractions, leaking of fluid and fetal movement were reviewed in detail with the patient. Please refer to After Visit Summary for other counseling recommendations.  Return in about 4 weeks (around 02/25/2016) for OB f/u.  Truett Mainland, DO

## 2016-01-28 NOTE — Progress Notes (Signed)
Patient reports contractions over the weekend that were quite intense at times, reports less contractions now but still having them. Patient desires exam

## 2016-01-29 ENCOUNTER — Other Ambulatory Visit: Payer: Self-pay | Admitting: Family Medicine

## 2016-01-29 ENCOUNTER — Encounter: Payer: Self-pay | Admitting: Family Medicine

## 2016-01-29 LAB — WET PREP, GENITAL
TRICH WET PREP: NONE SEEN
YEAST WET PREP: NONE SEEN

## 2016-01-29 MED ORDER — METRONIDAZOLE 500 MG PO TABS: 500.0000 mg | ORAL_TABLET | Freq: Two times a day (BID) | ORAL | 0 refills | Status: DC

## 2016-02-02 ENCOUNTER — Other Ambulatory Visit: Payer: Self-pay | Admitting: General Practice

## 2016-02-02 DIAGNOSIS — N760 Acute vaginitis: Principal | ICD-10-CM

## 2016-02-02 DIAGNOSIS — B9689 Other specified bacterial agents as the cause of diseases classified elsewhere: Secondary | ICD-10-CM

## 2016-02-02 MED ORDER — METRONIDAZOLE 500 MG PO TABS: 500.0000 mg | ORAL_TABLET | Freq: Two times a day (BID) | ORAL | 0 refills | Status: DC

## 2016-02-03 LAB — CYTOLOGY - PAP
Diagnosis: NEGATIVE
HPV (WINDOPATH): DETECTED — AB
HPV 16/18/45 GENOTYPING: NEGATIVE

## 2016-02-04 ENCOUNTER — Encounter: Payer: Self-pay | Admitting: Family Medicine

## 2016-02-04 DIAGNOSIS — R8789 Other abnormal findings in specimens from female genital organs: Secondary | ICD-10-CM | POA: Insufficient documentation

## 2016-02-04 DIAGNOSIS — R87618 Other abnormal cytological findings on specimens from cervix uteri: Secondary | ICD-10-CM | POA: Insufficient documentation

## 2016-02-17 ENCOUNTER — Other Ambulatory Visit: Payer: Self-pay | Admitting: Family Medicine

## 2016-02-17 ENCOUNTER — Other Ambulatory Visit (HOSPITAL_COMMUNITY): Payer: Self-pay | Admitting: *Deleted

## 2016-02-17 ENCOUNTER — Ambulatory Visit (HOSPITAL_COMMUNITY)
Admission: RE | Admit: 2016-02-17 | Discharge: 2016-02-17 | Disposition: A | Discharge status: Home / Self Care | Payer: Non-veteran care | Source: Ambulatory Visit | Attending: Family Medicine | Admitting: Family Medicine

## 2016-02-17 DIAGNOSIS — Z363 Encounter for antenatal screening for malformations: Secondary | ICD-10-CM

## 2016-02-17 DIAGNOSIS — O09522 Supervision of elderly multigravida, second trimester: Secondary | ICD-10-CM

## 2016-02-17 DIAGNOSIS — Z3A18 18 weeks gestation of pregnancy: Secondary | ICD-10-CM

## 2016-02-17 DIAGNOSIS — O359XX0 Maternal care for (suspected) fetal abnormality and damage, unspecified, not applicable or unspecified: Secondary | ICD-10-CM

## 2016-02-17 DIAGNOSIS — O0990 Supervision of high risk pregnancy, unspecified, unspecified trimester: Secondary | ICD-10-CM

## 2016-02-17 DIAGNOSIS — O34219 Maternal care for unspecified type scar from previous cesarean delivery: Secondary | ICD-10-CM

## 2016-02-17 DIAGNOSIS — O358XX0 Maternal care for other (suspected) fetal abnormality and damage, not applicable or unspecified: Secondary | ICD-10-CM | POA: Insufficient documentation

## 2016-02-17 DIAGNOSIS — O09529 Supervision of elderly multigravida, unspecified trimester: Secondary | ICD-10-CM

## 2016-02-17 DIAGNOSIS — O09521 Supervision of elderly multigravida, first trimester: Secondary | ICD-10-CM

## 2016-02-25 ENCOUNTER — Ambulatory Visit (INDEPENDENT_AMBULATORY_CARE_PROVIDER_SITE_OTHER): Payer: Non-veteran care | Admitting: Obstetrics and Gynecology

## 2016-02-25 VITALS — BP 109/69 | HR 63 | Wt 184.8 lb

## 2016-02-25 DIAGNOSIS — O09521 Supervision of elderly multigravida, first trimester: Secondary | ICD-10-CM

## 2016-02-25 DIAGNOSIS — O0990 Supervision of high risk pregnancy, unspecified, unspecified trimester: Secondary | ICD-10-CM

## 2016-02-25 DIAGNOSIS — O09512 Supervision of elderly primigravida, second trimester: Secondary | ICD-10-CM

## 2016-02-25 DIAGNOSIS — O34219 Maternal care for unspecified type scar from previous cesarean delivery: Secondary | ICD-10-CM

## 2016-02-25 NOTE — Patient Instructions (Signed)
Second Trimester of Pregnancy The second trimester is from week 13 through week 28 (months 4 through 6). The second trimester is often a time when you feel your best. Your body has also adjusted to being pregnant, and you begin to feel better physically. Usually, morning sickness has lessened or quit completely, you may have more energy, and you may have an increase in appetite. The second trimester is also a time when the fetus is growing rapidly. At the end of the sixth month, the fetus is about 9 inches long and weighs about 1 pounds. You will likely begin to feel the baby move (quickening) between 18 and 20 weeks of the pregnancy. Body changes during your second trimester Your body continues to go through many changes during your second trimester. The changes vary from woman to woman.  Your weight will continue to increase. You will notice your lower abdomen bulging out.  You may begin to get stretch marks on your hips, abdomen, and breasts.  You may develop headaches that can be relieved by medicines. The medicines should be approved by your health care provider.  You may urinate more often because the fetus is pressing on your bladder.  You may develop or continue to have heartburn as a result of your pregnancy.  You may develop constipation because certain hormones are causing the muscles that push waste through your intestines to slow down.  You may develop hemorrhoids or swollen, bulging veins (varicose veins).  You may have back pain. This is caused by:  Weight gain.  Pregnancy hormones that are relaxing the joints in your pelvis.  A shift in weight and the muscles that support your balance.  Your breasts will continue to grow and they will continue to become tender.  Your gums may bleed and may be sensitive to brushing and flossing.  Dark spots or blotches (chloasma, mask of pregnancy) may develop on your face. This will likely fade after the baby is born.  A dark line  from your belly button to the pubic area (linea nigra) may appear. This will likely fade after the baby is born.  You may have changes in your hair. These can include thickening of your hair, rapid growth, and changes in texture. Some women also have hair loss during or after pregnancy, or hair that feels dry or thin. Your hair will most likely return to normal after your baby is born. What to expect at prenatal visits During a routine prenatal visit:  You will be weighed to make sure you and the fetus are growing normally.  Your blood pressure will be taken.  Your abdomen will be measured to track your baby's growth.  The fetal heartbeat will be listened to.  Any test results from the previous visit will be discussed. Your health care provider may ask you:  How you are feeling.  If you are feeling the baby move.  If you have had any abnormal symptoms, such as leaking fluid, bleeding, severe headaches, or abdominal cramping.  If you are using any tobacco products, including cigarettes, chewing tobacco, and electronic cigarettes.  If you have any questions. Other tests that may be performed during your second trimester include:  Blood tests that check for:  Low iron levels (anemia).  Gestational diabetes (between 24 and 28 weeks).  Rh antibodies. This is to check for a protein on red blood cells (Rh factor).  Urine tests to check for infections, diabetes, or protein in the urine.  An ultrasound to  confirm the proper growth and development of the baby.  An amniocentesis to check for possible genetic problems.  Fetal screens for spina bifida and Down syndrome.  HIV (human immunodeficiency virus) testing. Routine prenatal testing includes screening for HIV, unless you choose not to have this test. Follow these instructions at home: Eating and drinking  Continue to eat regular, healthy meals.  Avoid raw meat, uncooked cheese, cat litter boxes, and soil used by cats. These  carry germs that can cause birth defects in the baby.  Take your prenatal vitamins.  Take 1500-2000 mg of calcium daily starting at the 20th week of pregnancy until you deliver your baby.  If you develop constipation:  Take over-the-counter or prescription medicines.  Drink enough fluid to keep your urine clear or pale yellow.  Eat foods that are high in fiber, such as fresh fruits and vegetables, whole grains, and beans.  Limit foods that are high in fat and processed sugars, such as fried and sweet foods. Activity  Exercise only as directed by your health care provider. Experiencing uterine cramps is a good sign to stop exercising.  Avoid heavy lifting, wear low heel shoes, and practice good posture.  Wear your seat belt at all times when driving.  Rest with your legs elevated if you have leg cramps or low back pain.  Wear a good support bra for breast tenderness.  Do not use hot tubs, steam rooms, or saunas. Lifestyle  Avoid all smoking, herbs, alcohol, and unprescribed drugs. These chemicals affect the formation and growth of the baby.  Do not use any products that contain nicotine or tobacco, such as cigarettes and e-cigarettes. If you need help quitting, ask your health care provider.  A sexual relationship may be continued unless your health care provider directs you otherwise. General instructions  Follow your health care provider's instructions regarding medicine use. There are medicines that are either safe or unsafe to take during pregnancy.  Take warm sitz baths to soothe any pain or discomfort caused by hemorrhoids. Use hemorrhoid cream if your health care provider approves.  If you develop varicose veins, wear support hose. Elevate your feet for 15 minutes, 3-4 times a day. Limit salt in your diet.  Visit your dentist if you have not gone yet during your pregnancy. Use a soft toothbrush to brush your teeth and be gentle when you floss.  Keep all follow-up  prenatal visits as told by your health care provider. This is important. Contact a health care provider if:  You have dizziness.  You have mild pelvic cramps, pelvic pressure, or nagging pain in the abdominal area.  You have persistent nausea, vomiting, or diarrhea.  You have a bad smelling vaginal discharge.  You have pain with urination. Get help right away if:  You have a fever.  You are leaking fluid from your vagina.  You have spotting or bleeding from your vagina.  You have severe abdominal cramping or pain.  You have rapid weight gain or weight loss.  You have shortness of breath with chest pain.  You notice sudden or extreme swelling of your face, hands, ankles, feet, or legs.  You have not felt your baby move in over an hour.  You have severe headaches that do not go away with medicine.  You have vision changes. Summary  The second trimester is from week 13 through week 28 (months 4 through 6). It is also a time when the fetus is growing rapidly.  Your body goes  through many changes during pregnancy. The changes vary from woman to woman.  Avoid all smoking, herbs, alcohol, and unprescribed drugs. These chemicals affect the formation and growth your baby.  Do not use any tobacco products, such as cigarettes, chewing tobacco, and e-cigarettes. If you need help quitting, ask your health care provider.  Contact your health care provider if you have any questions. Keep all prenatal visits as told by your health care provider. This is important. This information is not intended to replace advice given to you by your health care provider. Make sure you discuss any questions you have with your health care provider. Document Released: 03/16/2001 Document Revised: 08/28/2015 Document Reviewed: 05/23/2012 Elsevier Interactive Patient Education  2017 [employer].

## 2016-02-25 NOTE — Progress Notes (Signed)
   PRENATAL VISIT NOTE  Subjective:  Brittany Vasquez is a 35 y.o. G3P2002 at [redacted]w[redacted]d being seen today for ongoing prenatal care.  She is currently monitored for the following issues for this high-risk pregnancy and has Mass of perirectal soft tissue - prob seb cyst; Disturbance of skin sensation; Supervision of high risk pregnancy, antepartum; AMA (advanced maternal age) multigravida 35+, first trimester; Previous cesarean section complicating pregnancy; and Pap smear abnormality of cervix/human papillomavirus (HPV) positive on her problem list.  Patient reports no complaints.  Contractions: Irritability. Vag. Bleeding: None.  Movement: Present. Denies leaking of fluid.   The following portions of the patient's history were reviewed and updated as appropriate: allergies, current medications, past family history, past medical history, past social history, past surgical history and problem list. Problem list updated.  Objective:   Vitals:   02/25/16 1001  BP: 109/69  Pulse: 63  Weight: 184 lb 12.8 oz (83.8 kg)    Fetal Status: Fetal Heart Rate (bpm): 147   Movement: Present     General:  Alert, oriented and cooperative. Patient is in no acute distress.  Skin: Skin is warm and dry. No rash noted.   Cardiovascular: Normal heart rate noted  Respiratory: Normal respiratory effort, no problems with respiration noted  Abdomen: Soft, gravid, appropriate for gestational age. Pain/Pressure: Present     Pelvic:  Cervical exam deferred        Extremities: Normal range of motion.  Edema: None  Mental Status: Normal mood and affect. Normal behavior. Normal judgment and thought content.   Assessment and Plan:  Pregnancy: G3P2002 at [redacted]w[redacted]d  1. Supervision of high risk pregnancy, antepartum -continue routine care -discussed Echogenic intracardiac focus and implications given normal first trimester screen -repeat imaging ordered -follow up in 4 wks -offered AFP only patient declined  2. AMA  (advanced maternal age) multigravida 4+, first trimester -routine care  3. Previous cesarean section complicating pregnancy -Plans to VBAC will consent closer to labor  Term labor symptoms and general obstetric precautions including but not limited to vaginal bleeding, contractions, leaking of fluid and fetal movement were reviewed in detail with the patient. Please refer to After Visit Summary for other counseling recommendations.  Return in about 4 weeks (around 03/24/2016) for HROB.   Waldemar Dickens, MD

## 2016-03-17 ENCOUNTER — Telehealth: Payer: Self-pay | Admitting: *Deleted

## 2016-03-17 ENCOUNTER — Ambulatory Visit (HOSPITAL_COMMUNITY)
Admission: RE | Admit: 2016-03-17 | Discharge: 2016-03-17 | Disposition: A | Discharge status: Home / Self Care | Payer: Non-veteran care | Source: Ambulatory Visit | Attending: Family Medicine | Admitting: Family Medicine

## 2016-03-17 ENCOUNTER — Encounter (HOSPITAL_COMMUNITY): Payer: Self-pay

## 2016-03-17 DIAGNOSIS — O358XX0 Maternal care for other (suspected) fetal abnormality and damage, not applicable or unspecified: Secondary | ICD-10-CM | POA: Insufficient documentation

## 2016-03-17 DIAGNOSIS — Z3A22 22 weeks gestation of pregnancy: Secondary | ICD-10-CM | POA: Diagnosis not present

## 2016-03-17 DIAGNOSIS — O34211 Maternal care for low transverse scar from previous cesarean delivery: Secondary | ICD-10-CM | POA: Diagnosis not present

## 2016-03-17 DIAGNOSIS — Z363 Encounter for antenatal screening for malformations: Secondary | ICD-10-CM | POA: Insufficient documentation

## 2016-03-17 DIAGNOSIS — Z362 Encounter for other antenatal screening follow-up: Secondary | ICD-10-CM | POA: Diagnosis not present

## 2016-03-17 DIAGNOSIS — O09522 Supervision of elderly multigravida, second trimester: Secondary | ICD-10-CM | POA: Diagnosis not present

## 2016-03-17 DIAGNOSIS — O09529 Supervision of elderly multigravida, unspecified trimester: Secondary | ICD-10-CM

## 2016-03-17 NOTE — Telephone Encounter (Signed)
Pt left message @ [phone] wanting to know how common it is to leak amniotic fluid. She stated that her panty liner just got "soaked".  I called pt back and discussed her situation. She reports that she had a large amount of clear fluid leak out earlier when she left the message and since that time she has had a small amount more. The fluid was clear and slightly "sticky". I advised pt that she needs evaluation @ [facility] for possible ruptured membranes and recommended that she go today. Pt voiced understanding.

## 2016-03-23 ENCOUNTER — Ambulatory Visit (INDEPENDENT_AMBULATORY_CARE_PROVIDER_SITE_OTHER): Payer: Non-veteran care | Admitting: Obstetrics and Gynecology

## 2016-03-23 VITALS — BP 104/72 | HR 73 | Wt 187.9 lb

## 2016-03-23 DIAGNOSIS — O09522 Supervision of elderly multigravida, second trimester: Secondary | ICD-10-CM

## 2016-03-23 DIAGNOSIS — O0990 Supervision of high risk pregnancy, unspecified, unspecified trimester: Secondary | ICD-10-CM

## 2016-03-23 DIAGNOSIS — O34219 Maternal care for unspecified type scar from previous cesarean delivery: Secondary | ICD-10-CM

## 2016-03-23 NOTE — Patient Instructions (Signed)
Postpartum Tubal Ligation Postpartum tubal ligation (PPTL) is a procedure to close the fallopian tubes. This is done so that you cannot get pregnant. When the fallopian tubes are closed, the eggs that the ovaries release cannot enter the uterus, and sperm cannot reach the eggs. PPTL is done right after childbirth or 1-2 days after childbirth, before the uterus returns to its normal location. PPTL is sometimes called "getting your tubes tied." You should not have this procedure if you want to get pregnant someday or if you are unsure about having more children. Tell a health care provider about:  Any allergies you have.  All medicines you are taking, including vitamins, herbs, eye drops, creams, and over-the-counter medicines.  Previous problems you or members of your family have had with the use of anesthetics.  Any blood disorders you have.  Previous surgeries you have had.  Any medical conditions you may have.  Any past pregnancies. What are the risks? Generally, this is a safe procedure. However, problems may occur, including:  Infection.  Bleeding.  Injury to surrounding organs.  Side effects from anesthetics.  Failure of the procedure. This procedure can increase your risk of a kind of pregnancy in which a fertilized egg attaches to the outside of the uterus (ectopic pregnancy). What happens before the procedure?  Ask your health care provider about:  How much pain you can expect to have.  What medicines you will be given for pain, especially if you are planning to breastfeed.  Follow instructions from your health care provider about eating and drinking restrictions. What happens during the procedure? If you had a vaginal delivery:  You may be given one or more of the following:  A medicine that helps you relax (sedative).  A medicine to numb the area (local anesthetic).  A medicine to make you fall asleep (general anesthetic).  A medicine that is injected into  an area of your body to numb everything below the injection site (regional anesthetic).  If you have been given a general anesthetic, a tube will be put down your throat to help you breathe.  An IV tube will be inserted into one of your veins to give you medicines and fluids during the procedure.  Your bladder may be emptied with a small tube (catheter).  An incision will be made just below your belly button.  Your fallopian tubes will be located and brought up through the incision.  Your fallopian tubes will be tied off, burned (cauterized), or blocked with a clip, ring, or clamp. A small portion in the center of each fallopian tube may be removed.  The incision will be closed with stitches (sutures).  A bandage (dressing) will be placed over the incision. If you had a cesarean delivery:  Tubal ligation will be done through the incision that was used for the cesarean delivery of your baby.  The incision will be closed with sutures.  A dressing will be placed over the incision. The procedure may vary among health care providers and hospitals. What happens after the procedure?  Your blood pressure, heart rate, breathing rate, and blood oxygen level will be monitored often until the medicines you were given have worn off.  You will be given pain medicine as needed.  Do not drive for 24 hours if you received a sedative. This information is not intended to replace advice given to you by your health care provider. Make sure you discuss any questions you have with your health care provider. Document  Released: 03/22/2005 Document Revised: 08/25/2015 Document Reviewed: 03/02/2015 Elsevier Interactive Patient Education  2017 [employer].

## 2016-03-23 NOTE — Progress Notes (Signed)
   PRENATAL VISIT NOTE  Subjective:  Brittany Vasquez is a 35 y.o. G3P2002 at [redacted]w[redacted]d being seen today for ongoing prenatal care.  She is currently monitored for the following issues for this high-risk pregnancy and has Mass of perirectal soft tissue - prob seb cyst; Disturbance of skin sensation; Supervision of high risk pregnancy, antepartum; AMA (advanced maternal age) multigravida 35+, first trimester; Previous cesarean section complicating pregnancy; and Pap smear abnormality of cervix/human papillomavirus (HPV) positive on her problem list.  Patient reports no complaints.  Contractions: Irritability. Vag. Bleeding: None.  Movement: Present. Denies leaking of fluid.   The following portions of the patient's history were reviewed and updated as appropriate: allergies, current medications, past family history, past medical history, past social history, past surgical history and problem list. Problem list updated.  Objective:   Vitals:   03/23/16 1251  BP: 104/72  Pulse: 73  Weight: 187 lb 14.4 oz (85.2 kg)    Fetal Status: Fetal Heart Rate (bpm): 152   Movement: Present     General:  Alert, oriented and cooperative. Patient is in no acute distress.  Skin: Skin is warm and dry. No rash noted.   Cardiovascular: Normal heart rate noted  Respiratory: Normal respiratory effort, no problems with respiration noted  Abdomen: Soft, gravid, appropriate for gestational age. Pain/Pressure: Present     Pelvic:  Cervical exam deferred        Extremities: Normal range of motion.  Edema: None  Mental Status: Normal mood and affect. Normal behavior. Normal judgment and thought content.   Assessment and Plan:  Pregnancy: G3P2002 at [redacted]w[redacted]d  1. Supervision of high risk pregnancy, antepartum -routine care -discussed VBAC which the patient would like.  -dicussed that we would like to have patient labor in hospital given history of c-section -plan for 2hr gtt at next visit. Patient declines all  vaccines.   Term labor symptoms and general obstetric precautions including but not limited to vaginal bleeding, contractions, leaking of fluid and fetal movement were reviewed in detail with the patient. Please refer to After Visit Summary for other counseling recommendations.  Return in about 4 weeks (around 04/20/2016) for for 2hr gtt and provider visit.   Waldemar Dickens, MD

## 2016-04-05 NOTE — L&D Delivery Note (Addendum)
36 y.o. G3P2002 at [redacted]w[redacted]d came up and precipitously delivered in hands and knees position, a viable female infant in cephalic, LOA position. Loose nuchal cord x1, easily reduced at perineum. Right anterior shoulder delivered with ease. 60 sec delayed cord clamping. Baby was vigorously crying with good tone. Cord clamped x2 and cut. At about 3.5 minutes, baby became limp with low HR, code apgar was called and baby taken to warmer to be evaluated by NNT team. Arterial cord gas collected. Placenta delivered spontaneously intact, with 3VC. Fundus firm on exam with massage and pitocin. Good hemostasis noted.  Anesthesia: None Laceration: None Suture: N/A Good hemostasis noted. EBL: 150cc  Arterial cord gas pH: pending  Mom and baby recovering in LDR.    Apgars: APGAR (1 MIN):  7 APGAR (5 MINS): 4 APGAR (10 MINS): 9 Weight: Pending baby going to nursery for monitoring  NNT team was called to the room, evaluated baby. Baby to be taken to nursery for close monitoring, NICU not warranted.   Katherine Basset, DO [facility] 07/09/2016, 2:17 PM

## 2016-04-22 ENCOUNTER — Encounter: Payer: Non-veteran care | Admitting: Family Medicine

## 2016-04-26 ENCOUNTER — Ambulatory Visit (INDEPENDENT_AMBULATORY_CARE_PROVIDER_SITE_OTHER): Payer: Medicare Other | Admitting: Family Medicine

## 2016-04-26 ENCOUNTER — Encounter: Payer: Self-pay | Admitting: *Deleted

## 2016-04-26 VITALS — BP 113/67 | HR 66 | Wt 192.8 lb

## 2016-04-26 DIAGNOSIS — Z113 Encounter for screening for infections with a predominantly sexual mode of transmission: Secondary | ICD-10-CM

## 2016-04-26 DIAGNOSIS — N898 Other specified noninflammatory disorders of vagina: Secondary | ICD-10-CM

## 2016-04-26 DIAGNOSIS — O09523 Supervision of elderly multigravida, third trimester: Secondary | ICD-10-CM

## 2016-04-26 DIAGNOSIS — O0990 Supervision of high risk pregnancy, unspecified, unspecified trimester: Secondary | ICD-10-CM

## 2016-04-26 DIAGNOSIS — O34219 Maternal care for unspecified type scar from previous cesarean delivery: Secondary | ICD-10-CM

## 2016-04-26 DIAGNOSIS — O09521 Supervision of elderly multigravida, first trimester: Secondary | ICD-10-CM

## 2016-04-26 LAB — CBC
HEMATOCRIT: 40.8 % (ref 35.0–45.0)
HEMOGLOBIN: 13.8 g/dL (ref 11.7–15.5)
MCH: 30.7 pg (ref 27.0–33.0)
MCHC: 33.8 g/dL (ref 32.0–36.0)
MCV: 90.9 fL (ref 80.0–100.0)
MPV: 9.3 fL (ref 7.5–12.5)
Platelets: 383 10*3/uL (ref 140–400)
RBC: 4.49 MIL/uL (ref 3.80–5.10)
RDW: 13.7 % (ref 11.0–15.0)
WBC: 10.7 10*3/uL (ref 3.8–10.8)

## 2016-04-26 MED ORDER — TETANUS-DIPHTH-ACELL PERTUSSIS 5-2.5-18.5 LF-MCG/0.5 IM SUSP: 0.5000 mL | Freq: Once | INTRAMUSCULAR | Status: DC

## 2016-04-26 NOTE — Progress Notes (Signed)
   PRENATAL VISIT NOTE  Subjective:  Brittany Vasquez is a 36 y.o. G3P2002 at [redacted]w[redacted]d being seen today for ongoing prenatal care.  She is currently monitored for the following issues for this low-risk pregnancy and has Mass of perirectal soft tissue - prob seb cyst; Disturbance of skin sensation; Supervision of high risk pregnancy, antepartum; AMA (advanced maternal age) multigravida 35+, first trimester; Previous cesarean section complicating pregnancy; and Pap smear abnormality of cervix/human papillomavirus (HPV) positive on her problem list.  Patient reports no complaints.  Contractions: Not present. Vag. Bleeding: None.  Movement: Present. Denies leaking of fluid.   The following portions of the patient's history were reviewed and updated as appropriate: allergies, current medications, past family history, past medical history, past social history, past surgical history and problem list. Problem list updated.  Objective:   Vitals:   04/26/16 0851  BP: 113/67  Pulse: 66  Weight: 192 lb 12.8 oz (87.5 kg)    Fetal Status: Fetal Heart Rate (bpm): 147 Fundal Height: 28 cm Movement: Present     General:  Alert, oriented and cooperative. Patient is in no acute distress.  Skin: Skin is warm and dry. No rash noted.   Cardiovascular: Normal heart rate noted  Respiratory: Normal respiratory effort, no problems with respiration noted  Abdomen: Soft, gravid, appropriate for gestational age. Pain/Pressure: Present     Pelvic:  Cervical exam deferred        Extremities: Normal range of motion.  Edema: Trace  Mental Status: Normal mood and affect. Normal behavior. Normal judgment and thought content.   Assessment and Plan:  Pregnancy: G3P2002 at [redacted]w[redacted]d  1. Supervision of high risk pregnancy, antepartum 28 wk labs Declines TDaP - takes no vaccines - CBC - RPR - HIV antibody (with reflex) - 2Hr GTT w/ 1 Hr Carpenter 75 g  2. AMA (advanced maternal age) multigravida 62+, first  trimester Normal first trimester screen  3. Previous cesarean section complicating pregnancy Consented for VBAC today--risks reviewed. Consent signed. Desires BTL--consent signed.  4. Vaginal discharge No vaginal irritation. - Cervicovaginal ancillary only  Preterm labor symptoms and general obstetric precautions including but not limited to vaginal bleeding, contractions, leaking of fluid and fetal movement were reviewed in detail with the patient. Please refer to After Visit Summary for other counseling recommendations.  Return in 2 weeks (on 05/10/2016).   Donnamae Jude, MD

## 2016-04-26 NOTE — Progress Notes (Signed)
BTL and VBAC consents signed.

## 2016-04-26 NOTE — Patient Instructions (Signed)
Third Trimester of Pregnancy The third trimester is from week 29 through week 40 (months 7 through 9). The third trimester is a time when the unborn baby (fetus) is growing rapidly. At the end of the ninth month, the fetus is about 20 inches in length and weighs 6-10 pounds. Body changes during your third trimester Your body goes through many changes during pregnancy. The changes vary from woman to woman. During the third trimester:  Your weight will continue to increase. You can expect to gain 25-35 pounds (11-16 kg) by the end of the pregnancy.  You may begin to get stretch marks on your hips, abdomen, and breasts.  You may urinate more often because the fetus is moving lower into your pelvis and pressing on your bladder.  You may develop or continue to have heartburn. This is caused by increased hormones that slow down muscles in the digestive tract.  You may develop or continue to have constipation because increased hormones slow digestion and cause the muscles that push waste through your intestines to relax.  You may develop hemorrhoids. These are swollen veins (varicose veins) in the rectum that can itch or be painful.  You may develop swollen, bulging veins (varicose veins) in your legs.  You may have increased body aches in the pelvis, back, or thighs. This is due to weight gain and increased hormones that are relaxing your joints.  You may have changes in your hair. These can include thickening of your hair, rapid growth, and changes in texture. Some women also have hair loss during or after pregnancy, or hair that feels dry or thin. Your hair will most likely return to normal after your baby is born.  Your breasts will continue to grow and they will continue to become tender. A yellow fluid (colostrum) may leak from your breasts. This is the first milk you are producing for your baby.  Your belly button may stick out.  You may notice more swelling in your hands, face, or  ankles.  You may have increased tingling or numbness in your hands, arms, and legs. The skin on your belly may also feel numb.  You may feel short of breath because of your expanding uterus.  You may have more problems sleeping. This can be caused by the size of your belly, increased need to urinate, and an increase in your body's metabolism.  You may notice the fetus "dropping," or moving lower in your abdomen.  You may have increased vaginal discharge.  Your cervix becomes thin and soft (effaced) near your due date. What to expect at prenatal visits You will have prenatal exams every 2 weeks until week 36. Then you will have weekly prenatal exams. During a routine prenatal visit:  You will be weighed to make sure you and the fetus are growing normally.  Your blood pressure will be taken.  Your abdomen will be measured to track your baby's growth.  The fetal heartbeat will be listened to.  Any test results from the previous visit will be discussed.  You may have a cervical check near your due date to see if you have effaced. At around 36 weeks, your health care provider will check your cervix. At the same time, your health care provider will also perform a test on the secretions of the vaginal tissue. This test is to determine if a type of bacteria, Group B streptococcus, is present. Your health care provider will explain this further. Your health care provider may ask you:    What your birth plan is.  How you are feeling.  If you are feeling the baby move.  If you have had any abnormal symptoms, such as leaking fluid, bleeding, severe headaches, or abdominal cramping.  If you are using any tobacco products, including cigarettes, chewing tobacco, and electronic cigarettes.  If you have any questions. Other tests or screenings that may be performed during your third trimester include:  Blood tests that check for low iron levels (anemia).  Fetal testing to check the health,  activity level, and growth of the fetus. Testing is done if you have certain medical conditions or if there are problems during the pregnancy.  Nonstress test (NST). This test checks the health of your baby to make sure there are no signs of problems, such as the baby not getting enough oxygen. During this test, a belt is placed around your belly. The baby is made to move, and its heart rate is monitored during movement. What is false labor? False labor is a condition in which you feel small, irregular tightenings of the muscles in the womb (contractions) that eventually go away. These are called Braxton Hicks contractions. Contractions may last for hours, days, or even weeks before true labor sets in. If contractions come at regular intervals, become more frequent, increase in intensity, or become painful, you should see your health care provider. What are the signs of labor?  Abdominal cramps.  Regular contractions that start at 10 minutes apart and become stronger and more frequent with time.  Contractions that start on the top of the uterus and spread down to the lower abdomen and back.  Increased pelvic pressure and dull back pain.  A watery or bloody mucus discharge that comes from the vagina.  Leaking of amniotic fluid. This is also known as your "water breaking." It could be a slow trickle or a gush. Let your doctor know if it has a color or strange odor. If you have any of these signs, call your health care provider right away, even if it is before your due date. Follow these instructions at home: Eating and drinking  Continue to eat regular, healthy meals.  Do not eat:  Raw meat or meat spreads.  Unpasteurized milk or cheese.  Unpasteurized juice.  Store-made salad.  Refrigerated smoked seafood.  Hot dogs or deli meat, unless they are piping hot.  More than 6 ounces of albacore tuna a week.  Shark, swordfish, king mackerel, or tile fish.  Store-made salads.  Raw  sprouts, such as mung bean or alfalfa sprouts.  Take prenatal vitamins as told by your health care provider.  Take 1000 mg of calcium daily as told by your health care provider.  If you develop constipation:  Take over-the-counter or prescription medicines.  Drink enough fluid to keep your urine clear or pale yellow.  Eat foods that are high in fiber, such as fresh fruits and vegetables, whole grains, and beans.  Limit foods that are high in fat and processed sugars, such as fried and sweet foods. Activity  Exercise only as directed by your health care provider. Healthy pregnant women should aim for 2 hours and 30 minutes of moderate exercise per week. If you experience any pain or discomfort while exercising, stop.  Avoid heavy lifting.  Do not exercise in extreme heat or humidity, or at high altitudes.  Wear low-heel, comfortable shoes.  Practice good posture.  Do not travel far distances unless it is absolutely necessary and only with the approval   of your health care provider.  Wear your seat belt at all times while in a car, on a bus, or on a plane.  Take frequent breaks and rest with your legs elevated if you have leg cramps or low back pain.  Do not use hot tubs, steam rooms, or saunas.  You may continue to have sex unless your health care provider tells you otherwise. Lifestyle  Do not use any products that contain nicotine or tobacco, such as cigarettes and e-cigarettes. If you need help quitting, ask your health care provider.  Do not drink alcohol.  Do not use any medicinal herbs or unprescribed drugs. These chemicals affect the formation and growth of the baby.  If you develop varicose veins:  Wear support pantyhose or compression stockings as told by your healthcare provider.  Elevate your feet for 15 minutes, 3-4 times a day.  Wear a supportive maternity bra to help with breast tenderness. General instructions  Take over-the-counter and prescription  medicines only as told by your health care provider. There are medicines that are either safe or unsafe to take during pregnancy.  Take warm sitz baths to soothe any pain or discomfort caused by hemorrhoids. Use hemorrhoid cream or witch hazel if your health care provider approves.  Avoid cat litter boxes and soil used by cats. These carry germs that can cause birth defects in the baby. If you have a cat, ask someone to clean the litter box for you.  To prepare for the arrival of your baby:  Take prenatal classes to understand, practice, and ask questions about the labor and delivery.  Make a trial run to the hospital.  Visit the hospital and tour the maternity area.  Arrange for maternity or paternity leave through employers.  Arrange for family and friends to take care of pets while you are in the hospital.  Purchase a rear-facing car seat and make sure you know how to install it in your car.  Pack your hospital bag.  Prepare the baby's nursery. Make sure to remove all pillows and stuffed animals from the baby's crib to prevent suffocation.  Visit your dentist if you have not gone during your pregnancy. Use a soft toothbrush to brush your teeth and be gentle when you floss.  Keep all prenatal follow-up visits as told by your health care provider. This is important. Contact a health care provider if:  You are unsure if you are in labor or if your water has broken.  You become dizzy.  You have mild pelvic cramps, pelvic pressure, or nagging pain in your abdominal area.  You have lower back pain.  You have persistent nausea, vomiting, or diarrhea.  You have an unusual or bad smelling vaginal discharge.  You have pain when you urinate. Get help right away if:  You have a fever.  You are leaking fluid from your vagina.  You have spotting or bleeding from your vagina.  You have severe abdominal pain or cramping.  You have rapid weight loss or weight gain.  You have  shortness of breath with chest pain.  You notice sudden or extreme swelling of your face, hands, ankles, feet, or legs.  Your baby makes fewer than 10 movements in 2 hours.  You have severe headaches that do not go away with medicine.  You have vision changes. Summary  The third trimester is from week 29 through week 40, months 7 through 9. The third trimester is a time when the unborn baby (fetus)   is growing rapidly.  During the third trimester, your discomfort may increase as you and your baby continue to gain weight. You may have abdominal, leg, and back pain, sleeping problems, and an increased need to urinate.  During the third trimester your breasts will keep growing and they will continue to become tender. A yellow fluid (colostrum) may leak from your breasts. This is the first milk you are producing for your baby.  False labor is a condition in which you feel small, irregular tightenings of the muscles in the womb (contractions) that eventually go away. These are called Braxton Hicks contractions. Contractions may last for hours, days, or even weeks before true labor sets in.  Signs of labor can include: abdominal cramps; regular contractions that start at 10 minutes apart and become stronger and more frequent with time; watery or bloody mucus discharge that comes from the vagina; increased pelvic pressure and dull back pain; and leaking of amniotic fluid. This information is not intended to replace advice given to you by your health care provider. Make sure you discuss any questions you have with your health care provider. Document Released: 03/16/2001 Document Revised: 08/28/2015 Document Reviewed: 05/23/2012 [employer] Interactive Patient Education  2017 [employer]   Breastfeeding Deciding to breastfeed is one of the best choices you can make for you and your baby. A change in hormones during pregnancy causes your breast tissue to grow and increases the number and size of your  milk ducts. These hormones also allow proteins, sugars, and fats from your blood supply to make breast milk in your milk-producing glands. Hormones prevent breast milk from being released before your baby is born as well as prompt milk flow after birth. Once breastfeeding has begun, thoughts of your baby, as well as his or her sucking or crying, can stimulate the release of milk from your milk-producing glands. Benefits of breastfeeding For Your Baby  Your first milk (colostrum) helps your baby's digestive system function better.  There are antibodies in your milk that help your baby fight off infections.  Your baby has a lower incidence of asthma, allergies, and sudden infant death syndrome.  The nutrients in breast milk are better for your baby than infant formulas and are designed uniquely for your baby's needs.  Breast milk improves your baby's brain development.  Your baby is less likely to develop other conditions, such as childhood obesity, asthma, or type 2 diabetes mellitus. For You  Breastfeeding helps to create a very special bond between you and your baby.  Breastfeeding is convenient. Breast milk is always available at the correct temperature and costs nothing.  Breastfeeding helps to burn calories and helps you lose the weight gained during pregnancy.  Breastfeeding makes your uterus contract to its prepregnancy size faster and slows bleeding (lochia) after you give birth.  Breastfeeding helps to lower your risk of developing type 2 diabetes mellitus, osteoporosis, and breast or ovarian cancer later in life. Signs that your baby is hungry Early Signs of Hunger  Increased alertness or activity.  Stretching.  Movement of the head from side to side.  Movement of the head and opening of the mouth when the corner of the mouth or cheek is stroked (rooting).  Increased sucking sounds, smacking lips, cooing, sighing, or squeaking.  Hand-to-mouth movements.  Increased  sucking of fingers or hands. Late Signs of Hunger  Fussing.  Intermittent crying. Extreme Signs of Hunger  Signs of extreme hunger will require calming and consoling before your baby will   be able to breastfeed successfully. Do not wait for the following signs of extreme hunger to occur before you initiate breastfeeding:  Restlessness.  A loud, strong cry.  Screaming. Breastfeeding basics  Breastfeeding Initiation  Find a comfortable place to sit or lie down, with your neck and back well supported.  Place a pillow or rolled up blanket under your baby to bring him or her to the level of your breast (if you are seated). Nursing pillows are specially designed to help support your arms and your baby while you breastfeed.  Make sure that your baby's abdomen is facing your abdomen.  Gently massage your breast. With your fingertips, massage from your chest wall toward your nipple in a circular motion. This encourages milk flow. You may need to continue this action during the feeding if your milk flows slowly.  Support your breast with 4 fingers underneath and your thumb above your nipple. Make sure your fingers are well away from your nipple and your baby's mouth.  Stroke your baby's lips gently with your finger or nipple.  When your baby's mouth is open wide enough, quickly bring your baby to your breast, placing your entire nipple and as much of the colored area around your nipple (areola) as possible into your baby's mouth.  More areola should be visible above your baby's upper lip than below the lower lip.  Your baby's tongue should be between his or her lower gum and your breast.  Ensure that your baby's mouth is correctly positioned around your nipple (latched). Your baby's lips should create a seal on your breast and be turned out (everted).  It is common for your baby to suck about 2-3 minutes in order to start the flow of breast milk. Latching  Teaching your baby how to latch  on to your breast properly is very important. An improper latch can cause nipple pain and decreased milk supply for you and poor weight gain in your baby. Also, if your baby is not latched onto your nipple properly, he or she may swallow some air during feeding. This can make your baby fussy. Burping your baby when you switch breasts during the feeding can help to get rid of the air. However, teaching your baby to latch on properly is still the best way to prevent fussiness from swallowing air while breastfeeding. Signs that your baby has successfully latched on to your nipple:  Silent tugging or silent sucking, without causing you pain.  Swallowing heard between every 3-4 sucks.  Muscle movement above and in front of his or her ears while sucking. Signs that your baby has not successfully latched on to nipple:  Sucking sounds or smacking sounds from your baby while breastfeeding.  Nipple pain. If you think your baby has not latched on correctly, slip your finger into the corner of your baby's mouth to break the suction and place it between your baby's gums. Attempt breastfeeding initiation again. Signs of Successful Breastfeeding  Signs from your baby:  A gradual decrease in the number of sucks or complete cessation of sucking.  Falling asleep.  Relaxation of his or her body.  Retention of a small amount of milk in his or her mouth.  Letting go of your breast by himself or herself. Signs from you:  Breasts that have increased in firmness, weight, and size 1-3 hours after feeding.  Breasts that are softer immediately after breastfeeding.  Increased milk volume, as well as a change in milk consistency and color by   the fifth day of breastfeeding.  Nipples that are not sore, cracked, or bleeding. Signs That Your Baby is Getting Enough Milk  Wetting at least 1-2 diapers during the first 24 hours after birth.  Wetting at least 5-6 diapers every 24 hours for the first week after  birth. The urine should be clear or pale yellow by 5 days after birth.  Wetting 6-8 diapers every 24 hours as your baby continues to grow and develop.  At least 3 stools in a 24-hour period by age 5 days. The stool should be soft and yellow.  At least 3 stools in a 24-hour period by age 7 days. The stool should be seedy and yellow.  No loss of weight greater than 10% of birth weight during the first 3 days of age.  Average weight gain of 4-7 ounces (113-198 g) per week after age 4 days.  Consistent daily weight gain by age 5 days, without weight loss after the age of 2 weeks. After a feeding, your baby may spit up a small amount. This is common. Breastfeeding frequency and duration Frequent feeding will help you make more milk and can prevent sore nipples and breast engorgement. Breastfeed when you feel the need to reduce the fullness of your breasts or when your baby shows signs of hunger. This is called "breastfeeding on demand." Avoid introducing a pacifier to your baby while you are working to establish breastfeeding (the first 4-6 weeks after your baby is born). After this time you may choose to use a pacifier. Research has shown that pacifier use during the first year of a baby's life decreases the risk of sudden infant death syndrome (SIDS). Allow your baby to feed on each breast as long as he or she wants. Breastfeed until your baby is finished feeding. When your baby unlatches or falls asleep while feeding from the first breast, offer the second breast. Because newborns are often sleepy in the first few weeks of life, you may need to awaken your baby to get him or her to feed. Breastfeeding times will vary from baby to baby. However, the following rules can serve as a guide to help you ensure that your baby is properly fed:  Newborns (babies 4 weeks of age or younger) may breastfeed every 1-3 hours.  Newborns should not go longer than 3 hours during the day or 5 hours during the night  without breastfeeding.  You should breastfeed your baby a minimum of 8 times in a 24-hour period until you begin to introduce solid foods to your baby at around 6 months of age. Breast milk pumping Pumping and storing breast milk allows you to ensure that your baby is exclusively fed your breast milk, even at times when you are unable to breastfeed. This is especially important if you are going back to work while you are still breastfeeding or when you are not able to be present during feedings. Your lactation consultant can give you guidelines on how long it is safe to store breast milk. A breast pump is a machine that allows you to pump milk from your breast into a sterile bottle. The pumped breast milk can then be stored in a refrigerator or freezer. Some breast pumps are operated by hand, while others use electricity. Ask your lactation consultant which type will work best for you. Breast pumps can be purchased, but some hospitals and breastfeeding support groups lease breast pumps on a monthly basis. A lactation consultant can teach you how to   hand express breast milk, if you prefer not to use a pump. Caring for your breasts while you breastfeed Nipples can become dry, cracked, and sore while breastfeeding. The following recommendations can help keep your breasts moisturized and healthy:  Avoid using soap on your nipples.  Wear a supportive bra. Although not required, special nursing bras and tank tops are designed to allow access to your breasts for breastfeeding without taking off your entire bra or top. Avoid wearing underwire-style bras or extremely tight bras.  Air dry your nipples for 3-4minutes after each feeding.  Use only cotton bra pads to absorb leaked breast milk. Leaking of breast milk between feedings is normal.  Use lanolin on your nipples after breastfeeding. Lanolin helps to maintain your skin's normal moisture barrier. If you use pure lanolin, you do not need to wash it off  before feeding your baby again. Pure lanolin is not toxic to your baby. You may also hand express a few drops of breast milk and gently massage that milk into your nipples and allow the milk to air dry. In the first few weeks after giving birth, some women experience extremely full breasts (engorgement). Engorgement can make your breasts feel heavy, warm, and tender to the touch. Engorgement peaks within 3-5 days after you give birth. The following recommendations can help ease engorgement:  Completely empty your breasts while breastfeeding or pumping. You may want to start by applying warm, moist heat (in the shower or with warm water-soaked hand towels) just before feeding or pumping. This increases circulation and helps the milk flow. If your baby does not completely empty your breasts while breastfeeding, pump any extra milk after he or she is finished.  Wear a snug bra (nursing or regular) or tank top for 1-2 days to signal your body to slightly decrease milk production.  Apply ice packs to your breasts, unless this is too uncomfortable for you.  Make sure that your baby is latched on and positioned properly while breastfeeding. If engorgement persists after 48 hours of following these recommendations, contact your health care provider or a lactation consultant. Overall health care recommendations while breastfeeding  Eat healthy foods. Alternate between meals and snacks, eating 3 of each per day. Because what you eat affects your breast milk, some of the foods may make your baby more irritable than usual. Avoid eating these foods if you are sure that they are negatively affecting your baby.  Drink milk, fruit juice, and water to satisfy your thirst (about 10 glasses a day).  Rest often, relax, and continue to take your prenatal vitamins to prevent fatigue, stress, and anemia.  Continue breast self-awareness checks.  Avoid chewing and smoking tobacco. Chemicals from cigarettes that pass  into breast milk and exposure to secondhand smoke may harm your baby.  Avoid alcohol and drug use, including marijuana. Some medicines that may be harmful to your baby can pass through breast milk. It is important to ask your health care provider before taking any medicine, including all over-the-counter and prescription medicine as well as vitamin and herbal supplements. It is possible to become pregnant while breastfeeding. If birth control is desired, ask your health care provider about options that will be safe for your baby. Contact a health care provider if:  You feel like you want to stop breastfeeding or have become frustrated with breastfeeding.  You have painful breasts or nipples.  Your nipples are cracked or bleeding.  Your breasts are red, tender, or warm.  You have   a swollen area on either breast.  You have a fever or chills.  You have nausea or vomiting.  You have drainage other than breast milk from your nipples.  Your breasts do not become full before feedings by the fifth day after you give birth.  You feel sad and depressed.  Your baby is too sleepy to eat well.  Your baby is having trouble sleeping.  Your baby is wetting less than 3 diapers in a 24-hour period.  Your baby has less than 3 stools in a 24-hour period.  Your baby's skin or the white part of his or her eyes becomes yellow.  Your baby is not gaining weight by 5 days of age. Get help right away if:  Your baby is overly tired (lethargic) and does not want to wake up and feed.  Your baby develops an unexplained fever. This information is not intended to replace advice given to you by your health care provider. Make sure you discuss any questions you have with your health care provider. Document Released: 03/22/2005 Document Revised: 09/03/2015 Document Reviewed: 09/13/2012 Elsevier Interactive Patient Education  2017 Elsevier Inc.  

## 2016-04-27 LAB — RPR

## 2016-04-27 LAB — 2HR GTT W 1 HR, CARPENTER, 75 G
GLUCOSE, 1 HR, GEST: 130 mg/dL (ref <180)
GLUCOSE, 2 HR, GEST: 122 mg/dL (ref <153)
GLUCOSE, FASTING, GEST: 81 mg/dL (ref 65–91)

## 2016-04-27 LAB — CERVICOVAGINAL ANCILLARY ONLY
BACTERIAL VAGINITIS: NEGATIVE
Candida vaginitis: NEGATIVE
Trichomonas: NEGATIVE

## 2016-04-27 LAB — HIV ANTIBODY (ROUTINE TESTING W REFLEX): HIV: NONREACTIVE

## 2016-05-18 ENCOUNTER — Ambulatory Visit (INDEPENDENT_AMBULATORY_CARE_PROVIDER_SITE_OTHER): Payer: Non-veteran care | Admitting: Family Medicine

## 2016-05-18 VITALS — BP 100/66 | HR 93 | Wt 195.0 lb

## 2016-05-18 DIAGNOSIS — K219 Gastro-esophageal reflux disease without esophagitis: Secondary | ICD-10-CM

## 2016-05-18 DIAGNOSIS — O0993 Supervision of high risk pregnancy, unspecified, third trimester: Secondary | ICD-10-CM

## 2016-05-18 DIAGNOSIS — O34219 Maternal care for unspecified type scar from previous cesarean delivery: Secondary | ICD-10-CM

## 2016-05-18 DIAGNOSIS — O0990 Supervision of high risk pregnancy, unspecified, unspecified trimester: Secondary | ICD-10-CM

## 2016-05-18 MED ORDER — OMEPRAZOLE 20 MG PO CPDR: 20.0000 mg | DELAYED_RELEASE_CAPSULE | Freq: Every day | ORAL | 3 refills | Status: DC

## 2016-05-18 NOTE — Progress Notes (Signed)
   PRENATAL VISIT NOTE  Subjective:  Brittany Vasquez is a 36 y.o. G3P2002 at [redacted]w[redacted]d being seen today for ongoing prenatal care.  She is currently monitored for the following issues for this low-risk pregnancy and has Mass of perirectal soft tissue - prob seb cyst; Disturbance of skin sensation; Supervision of high risk pregnancy, antepartum; AMA (advanced maternal age) multigravida 35+, first trimester; Previous cesarean section complicating pregnancy; and Pap smear abnormality of cervix/human papillomavirus (HPV) positive on her problem list.  Patient reports contractions since last few weeks, come and go.  Contractions: Irritability. Vag. Bleeding: None.  Movement: Present. Denies leaking of fluid.   The following portions of the patient's history were reviewed and updated as appropriate: allergies, current medications, past family history, past medical history, past social history, past surgical history and problem list. Problem list updated.  Objective:   Vitals:   05/18/16 0944  BP: 100/66  Pulse: 93  Weight: 195 lb (88.5 kg)    Fetal Status: Fetal Heart Rate (bpm): 159 Fundal Height: 31 cm Movement: Present     General:  Alert, oriented and cooperative. Patient is in no acute distress.  Skin: Skin is warm and dry. No rash noted.   Cardiovascular: Normal heart rate noted  Respiratory: Normal respiratory effort, no problems with respiration noted  Abdomen: Soft, gravid, appropriate for gestational age. Pain/Pressure: Present     Pelvic:  Cervical exam deferred        Extremities: Normal range of motion.  Edema: Trace  Mental Status: Normal mood and affect. Normal behavior. Normal judgment and thought content.   Assessment and Plan:  Pregnancy: G3P2002 at [redacted]w[redacted]d  1. Supervision of high risk pregnancy, antepartum PTL precautions  2. Previous cesarean section complicating pregnancy Desires TOLAC  3. Gastroesophageal reflux disease without esophagitis OTC meds (Tums and  Pepcid) not working - omeprazole (PRILOSEC) 20 MG capsule; Take 1 capsule (20 mg total) by mouth daily.  Dispense: 30 capsule; Refill: 3  Preterm labor symptoms and general obstetric precautions including but not limited to vaginal bleeding, contractions, leaking of fluid and fetal movement were reviewed in detail with the patient. Please refer to After Visit Summary for other counseling recommendations.  Return in 2 weeks (on 06/01/2016).   Donnamae Jude, MD

## 2016-05-18 NOTE — Patient Instructions (Signed)
Third Trimester of Pregnancy The third trimester is from week 29 through week 40 (months 7 through 9). The third trimester is a time when the unborn baby (fetus) is growing rapidly. At the end of the ninth month, the fetus is about 20 inches in length and weighs 6-10 pounds. Body changes during your third trimester Your body goes through many changes during pregnancy. The changes vary from woman to woman. During the third trimester:  Your weight will continue to increase. You can expect to gain 25-35 pounds (11-16 kg) by the end of the pregnancy.  You may begin to get stretch marks on your hips, abdomen, and breasts.  You may urinate more often because the fetus is moving lower into your pelvis and pressing on your bladder.  You may develop or continue to have heartburn. This is caused by increased hormones that slow down muscles in the digestive tract.  You may develop or continue to have constipation because increased hormones slow digestion and cause the muscles that push waste through your intestines to relax.  You may develop hemorrhoids. These are swollen veins (varicose veins) in the rectum that can itch or be painful.  You may develop swollen, bulging veins (varicose veins) in your legs.  You may have increased body aches in the pelvis, back, or thighs. This is due to weight gain and increased hormones that are relaxing your joints.  You may have changes in your hair. These can include thickening of your hair, rapid growth, and changes in texture. Some women also have hair loss during or after pregnancy, or hair that feels dry or thin. Your hair will most likely return to normal after your baby is born.  Your breasts will continue to grow and they will continue to become tender. A yellow fluid (colostrum) may leak from your breasts. This is the first milk you are producing for your baby.  Your belly button may stick out.  You may notice more swelling in your hands, face, or  ankles.  You may have increased tingling or numbness in your hands, arms, and legs. The skin on your belly may also feel numb.  You may feel short of breath because of your expanding uterus.  You may have more problems sleeping. This can be caused by the size of your belly, increased need to urinate, and an increase in your body's metabolism.  You may notice the fetus "dropping," or moving lower in your abdomen.  You may have increased vaginal discharge.  Your cervix becomes thin and soft (effaced) near your due date. What to expect at prenatal visits You will have prenatal exams every 2 weeks until week 36. Then you will have weekly prenatal exams. During a routine prenatal visit:  You will be weighed to make sure you and the fetus are growing normally.  Your blood pressure will be taken.  Your abdomen will be measured to track your baby's growth.  The fetal heartbeat will be listened to.  Any test results from the previous visit will be discussed.  You may have a cervical check near your due date to see if you have effaced. At around 36 weeks, your health care provider will check your cervix. At the same time, your health care provider will also perform a test on the secretions of the vaginal tissue. This test is to determine if a type of bacteria, Group B streptococcus, is present. Your health care provider will explain this further. Your health care provider may ask you:    What your birth plan is.  How you are feeling.  If you are feeling the baby move.  If you have had any abnormal symptoms, such as leaking fluid, bleeding, severe headaches, or abdominal cramping.  If you are using any tobacco products, including cigarettes, chewing tobacco, and electronic cigarettes.  If you have any questions. Other tests or screenings that may be performed during your third trimester include:  Blood tests that check for low iron levels (anemia).  Fetal testing to check the health,  activity level, and growth of the fetus. Testing is done if you have certain medical conditions or if there are problems during the pregnancy.  Nonstress test (NST). This test checks the health of your baby to make sure there are no signs of problems, such as the baby not getting enough oxygen. During this test, a belt is placed around your belly. The baby is made to move, and its heart rate is monitored during movement. What is false labor? False labor is a condition in which you feel small, irregular tightenings of the muscles in the womb (contractions) that eventually go away. These are called Braxton Hicks contractions. Contractions may last for hours, days, or even weeks before true labor sets in. If contractions come at regular intervals, become more frequent, increase in intensity, or become painful, you should see your health care provider. What are the signs of labor?  Abdominal cramps.  Regular contractions that start at 10 minutes apart and become stronger and more frequent with time.  Contractions that start on the top of the uterus and spread down to the lower abdomen and back.  Increased pelvic pressure and dull back pain.  A watery or bloody mucus discharge that comes from the vagina.  Leaking of amniotic fluid. This is also known as your "water breaking." It could be a slow trickle or a gush. Let your doctor know if it has a color or strange odor. If you have any of these signs, call your health care provider right away, even if it is before your due date. Follow these instructions at home: Eating and drinking  Continue to eat regular, healthy meals.  Do not eat:  Raw meat or meat spreads.  Unpasteurized milk or cheese.  Unpasteurized juice.  Store-made salad.  Refrigerated smoked seafood.  Hot dogs or deli meat, unless they are piping hot.  More than 6 ounces of albacore tuna a week.  Shark, swordfish, king mackerel, or tile fish.  Store-made salads.  Raw  sprouts, such as mung bean or alfalfa sprouts.  Take prenatal vitamins as told by your health care provider.  Take 1000 mg of calcium daily as told by your health care provider.  If you develop constipation:  Take over-the-counter or prescription medicines.  Drink enough fluid to keep your urine clear or pale yellow.  Eat foods that are high in fiber, such as fresh fruits and vegetables, whole grains, and beans.  Limit foods that are high in fat and processed sugars, such as fried and sweet foods. Activity  Exercise only as directed by your health care provider. Healthy pregnant women should aim for 2 hours and 30 minutes of moderate exercise per week. If you experience any pain or discomfort while exercising, stop.  Avoid heavy lifting.  Do not exercise in extreme heat or humidity, or at high altitudes.  Wear low-heel, comfortable shoes.  Practice good posture.  Do not travel far distances unless it is absolutely necessary and only with the approval   of your health care provider.  Wear your seat belt at all times while in a car, on a bus, or on a plane.  Take frequent breaks and rest with your legs elevated if you have leg cramps or low back pain.  Do not use hot tubs, steam rooms, or saunas.  You may continue to have sex unless your health care provider tells you otherwise. Lifestyle  Do not use any products that contain nicotine or tobacco, such as cigarettes and e-cigarettes. If you need help quitting, ask your health care provider.  Do not drink alcohol.  Do not use any medicinal herbs or unprescribed drugs. These chemicals affect the formation and growth of the baby.  If you develop varicose veins:  Wear support pantyhose or compression stockings as told by your healthcare provider.  Elevate your feet for 15 minutes, 3-4 times a day.  Wear a supportive maternity bra to help with breast tenderness. General instructions  Take over-the-counter and prescription  medicines only as told by your health care provider. There are medicines that are either safe or unsafe to take during pregnancy.  Take warm sitz baths to soothe any pain or discomfort caused by hemorrhoids. Use hemorrhoid cream or witch hazel if your health care provider approves.  Avoid cat litter boxes and soil used by cats. These carry germs that can cause birth defects in the baby. If you have a cat, ask someone to clean the litter box for you.  To prepare for the arrival of your baby:  Take prenatal classes to understand, practice, and ask questions about the labor and delivery.  Make a trial run to the hospital.  Visit the hospital and tour the maternity area.  Arrange for maternity or paternity leave through employers.  Arrange for family and friends to take care of pets while you are in the hospital.  Purchase a rear-facing car seat and make sure you know how to install it in your car.  Pack your hospital bag.  Prepare the baby's nursery. Make sure to remove all pillows and stuffed animals from the baby's crib to prevent suffocation.  Visit your dentist if you have not gone during your pregnancy. Use a soft toothbrush to brush your teeth and be gentle when you floss.  Keep all prenatal follow-up visits as told by your health care provider. This is important. Contact a health care provider if:  You are unsure if you are in labor or if your water has broken.  You become dizzy.  You have mild pelvic cramps, pelvic pressure, or nagging pain in your abdominal area.  You have lower back pain.  You have persistent nausea, vomiting, or diarrhea.  You have an unusual or bad smelling vaginal discharge.  You have pain when you urinate. Get help right away if:  You have a fever.  You are leaking fluid from your vagina.  You have spotting or bleeding from your vagina.  You have severe abdominal pain or cramping.  You have rapid weight loss or weight gain.  You have  shortness of breath with chest pain.  You notice sudden or extreme swelling of your face, hands, ankles, feet, or legs.  Your baby makes fewer than 10 movements in 2 hours.  You have severe headaches that do not go away with medicine.  You have vision changes. Summary  The third trimester is from week 29 through week 40, months 7 through 9. The third trimester is a time when the unborn baby (fetus)   is growing rapidly.  During the third trimester, your discomfort may increase as you and your baby continue to gain weight. You may have abdominal, leg, and back pain, sleeping problems, and an increased need to urinate.  During the third trimester your breasts will keep growing and they will continue to become tender. A yellow fluid (colostrum) may leak from your breasts. This is the first milk you are producing for your baby.  False labor is a condition in which you feel small, irregular tightenings of the muscles in the womb (contractions) that eventually go away. These are called Braxton Hicks contractions. Contractions may last for hours, days, or even weeks before true labor sets in.  Signs of labor can include: abdominal cramps; regular contractions that start at 10 minutes apart and become stronger and more frequent with time; watery or bloody mucus discharge that comes from the vagina; increased pelvic pressure and dull back pain; and leaking of amniotic fluid. This information is not intended to replace advice given to you by your health care provider. Make sure you discuss any questions you have with your health care provider. Document Released: 03/16/2001 Document Revised: 08/28/2015 Document Reviewed: 05/23/2012 [employer] Interactive Patient Education  2017 [employer]   Breastfeeding Deciding to breastfeed is one of the best choices you can make for you and your baby. A change in hormones during pregnancy causes your breast tissue to grow and increases the number and size of your  milk ducts. These hormones also allow proteins, sugars, and fats from your blood supply to make breast milk in your milk-producing glands. Hormones prevent breast milk from being released before your baby is born as well as prompt milk flow after birth. Once breastfeeding has begun, thoughts of your baby, as well as his or her sucking or crying, can stimulate the release of milk from your milk-producing glands. Benefits of breastfeeding For Your Baby  Your first milk (colostrum) helps your baby's digestive system function better.  There are antibodies in your milk that help your baby fight off infections.  Your baby has a lower incidence of asthma, allergies, and sudden infant death syndrome.  The nutrients in breast milk are better for your baby than infant formulas and are designed uniquely for your baby's needs.  Breast milk improves your baby's brain development.  Your baby is less likely to develop other conditions, such as childhood obesity, asthma, or type 2 diabetes mellitus. For You  Breastfeeding helps to create a very special bond between you and your baby.  Breastfeeding is convenient. Breast milk is always available at the correct temperature and costs nothing.  Breastfeeding helps to burn calories and helps you lose the weight gained during pregnancy.  Breastfeeding makes your uterus contract to its prepregnancy size faster and slows bleeding (lochia) after you give birth.  Breastfeeding helps to lower your risk of developing type 2 diabetes mellitus, osteoporosis, and breast or ovarian cancer later in life. Signs that your baby is hungry Early Signs of Hunger  Increased alertness or activity.  Stretching.  Movement of the head from side to side.  Movement of the head and opening of the mouth when the corner of the mouth or cheek is stroked (rooting).  Increased sucking sounds, smacking lips, cooing, sighing, or squeaking.  Hand-to-mouth movements.  Increased  sucking of fingers or hands. Late Signs of Hunger  Fussing.  Intermittent crying. Extreme Signs of Hunger  Signs of extreme hunger will require calming and consoling before your baby will   be able to breastfeed successfully. Do not wait for the following signs of extreme hunger to occur before you initiate breastfeeding:  Restlessness.  A loud, strong cry.  Screaming. Breastfeeding basics  Breastfeeding Initiation  Find a comfortable place to sit or lie down, with your neck and back well supported.  Place a pillow or rolled up blanket under your baby to bring him or her to the level of your breast (if you are seated). Nursing pillows are specially designed to help support your arms and your baby while you breastfeed.  Make sure that your baby's abdomen is facing your abdomen.  Gently massage your breast. With your fingertips, massage from your chest wall toward your nipple in a circular motion. This encourages milk flow. You may need to continue this action during the feeding if your milk flows slowly.  Support your breast with 4 fingers underneath and your thumb above your nipple. Make sure your fingers are well away from your nipple and your baby's mouth.  Stroke your baby's lips gently with your finger or nipple.  When your baby's mouth is open wide enough, quickly bring your baby to your breast, placing your entire nipple and as much of the colored area around your nipple (areola) as possible into your baby's mouth.  More areola should be visible above your baby's upper lip than below the lower lip.  Your baby's tongue should be between his or her lower gum and your breast.  Ensure that your baby's mouth is correctly positioned around your nipple (latched). Your baby's lips should create a seal on your breast and be turned out (everted).  It is common for your baby to suck about 2-3 minutes in order to start the flow of breast milk. Latching  Teaching your baby how to latch  on to your breast properly is very important. An improper latch can cause nipple pain and decreased milk supply for you and poor weight gain in your baby. Also, if your baby is not latched onto your nipple properly, he or she may swallow some air during feeding. This can make your baby fussy. Burping your baby when you switch breasts during the feeding can help to get rid of the air. However, teaching your baby to latch on properly is still the best way to prevent fussiness from swallowing air while breastfeeding. Signs that your baby has successfully latched on to your nipple:  Silent tugging or silent sucking, without causing you pain.  Swallowing heard between every 3-4 sucks.  Muscle movement above and in front of his or her ears while sucking. Signs that your baby has not successfully latched on to nipple:  Sucking sounds or smacking sounds from your baby while breastfeeding.  Nipple pain. If you think your baby has not latched on correctly, slip your finger into the corner of your baby's mouth to break the suction and place it between your baby's gums. Attempt breastfeeding initiation again. Signs of Successful Breastfeeding  Signs from your baby:  A gradual decrease in the number of sucks or complete cessation of sucking.  Falling asleep.  Relaxation of his or her body.  Retention of a small amount of milk in his or her mouth.  Letting go of your breast by himself or herself. Signs from you:  Breasts that have increased in firmness, weight, and size 1-3 hours after feeding.  Breasts that are softer immediately after breastfeeding.  Increased milk volume, as well as a change in milk consistency and color by   the fifth day of breastfeeding.  Nipples that are not sore, cracked, or bleeding. Signs That Your Baby is Getting Enough Milk  Wetting at least 1-2 diapers during the first 24 hours after birth.  Wetting at least 5-6 diapers every 24 hours for the first week after  birth. The urine should be clear or pale yellow by 5 days after birth.  Wetting 6-8 diapers every 24 hours as your baby continues to grow and develop.  At least 3 stools in a 24-hour period by age 5 days. The stool should be soft and yellow.  At least 3 stools in a 24-hour period by age 7 days. The stool should be seedy and yellow.  No loss of weight greater than 10% of birth weight during the first 3 days of age.  Average weight gain of 4-7 ounces (113-198 g) per week after age 4 days.  Consistent daily weight gain by age 5 days, without weight loss after the age of 2 weeks. After a feeding, your baby may spit up a small amount. This is common. Breastfeeding frequency and duration Frequent feeding will help you make more milk and can prevent sore nipples and breast engorgement. Breastfeed when you feel the need to reduce the fullness of your breasts or when your baby shows signs of hunger. This is called "breastfeeding on demand." Avoid introducing a pacifier to your baby while you are working to establish breastfeeding (the first 4-6 weeks after your baby is born). After this time you may choose to use a pacifier. Research has shown that pacifier use during the first year of a baby's life decreases the risk of sudden infant death syndrome (SIDS). Allow your baby to feed on each breast as long as he or she wants. Breastfeed until your baby is finished feeding. When your baby unlatches or falls asleep while feeding from the first breast, offer the second breast. Because newborns are often sleepy in the first few weeks of life, you may need to awaken your baby to get him or her to feed. Breastfeeding times will vary from baby to baby. However, the following rules can serve as a guide to help you ensure that your baby is properly fed:  Newborns (babies 4 weeks of age or younger) may breastfeed every 1-3 hours.  Newborns should not go longer than 3 hours during the day or 5 hours during the night  without breastfeeding.  You should breastfeed your baby a minimum of 8 times in a 24-hour period until you begin to introduce solid foods to your baby at around 6 months of age. Breast milk pumping Pumping and storing breast milk allows you to ensure that your baby is exclusively fed your breast milk, even at times when you are unable to breastfeed. This is especially important if you are going back to work while you are still breastfeeding or when you are not able to be present during feedings. Your lactation consultant can give you guidelines on how long it is safe to store breast milk. A breast pump is a machine that allows you to pump milk from your breast into a sterile bottle. The pumped breast milk can then be stored in a refrigerator or freezer. Some breast pumps are operated by hand, while others use electricity. Ask your lactation consultant which type will work best for you. Breast pumps can be purchased, but some hospitals and breastfeeding support groups lease breast pumps on a monthly basis. A lactation consultant can teach you how to   hand express breast milk, if you prefer not to use a pump. Caring for your breasts while you breastfeed Nipples can become dry, cracked, and sore while breastfeeding. The following recommendations can help keep your breasts moisturized and healthy:  Avoid using soap on your nipples.  Wear a supportive bra. Although not required, special nursing bras and tank tops are designed to allow access to your breasts for breastfeeding without taking off your entire bra or top. Avoid wearing underwire-style bras or extremely tight bras.  Air dry your nipples for 3-4minutes after each feeding.  Use only cotton bra pads to absorb leaked breast milk. Leaking of breast milk between feedings is normal.  Use lanolin on your nipples after breastfeeding. Lanolin helps to maintain your skin's normal moisture barrier. If you use pure lanolin, you do not need to wash it off  before feeding your baby again. Pure lanolin is not toxic to your baby. You may also hand express a few drops of breast milk and gently massage that milk into your nipples and allow the milk to air dry. In the first few weeks after giving birth, some women experience extremely full breasts (engorgement). Engorgement can make your breasts feel heavy, warm, and tender to the touch. Engorgement peaks within 3-5 days after you give birth. The following recommendations can help ease engorgement:  Completely empty your breasts while breastfeeding or pumping. You may want to start by applying warm, moist heat (in the shower or with warm water-soaked hand towels) just before feeding or pumping. This increases circulation and helps the milk flow. If your baby does not completely empty your breasts while breastfeeding, pump any extra milk after he or she is finished.  Wear a snug bra (nursing or regular) or tank top for 1-2 days to signal your body to slightly decrease milk production.  Apply ice packs to your breasts, unless this is too uncomfortable for you.  Make sure that your baby is latched on and positioned properly while breastfeeding. If engorgement persists after 48 hours of following these recommendations, contact your health care provider or a lactation consultant. Overall health care recommendations while breastfeeding  Eat healthy foods. Alternate between meals and snacks, eating 3 of each per day. Because what you eat affects your breast milk, some of the foods may make your baby more irritable than usual. Avoid eating these foods if you are sure that they are negatively affecting your baby.  Drink milk, fruit juice, and water to satisfy your thirst (about 10 glasses a day).  Rest often, relax, and continue to take your prenatal vitamins to prevent fatigue, stress, and anemia.  Continue breast self-awareness checks.  Avoid chewing and smoking tobacco. Chemicals from cigarettes that pass  into breast milk and exposure to secondhand smoke may harm your baby.  Avoid alcohol and drug use, including marijuana. Some medicines that may be harmful to your baby can pass through breast milk. It is important to ask your health care provider before taking any medicine, including all over-the-counter and prescription medicine as well as vitamin and herbal supplements. It is possible to become pregnant while breastfeeding. If birth control is desired, ask your health care provider about options that will be safe for your baby. Contact a health care provider if:  You feel like you want to stop breastfeeding or have become frustrated with breastfeeding.  You have painful breasts or nipples.  Your nipples are cracked or bleeding.  Your breasts are red, tender, or warm.  You have   a swollen area on either breast.  You have a fever or chills.  You have nausea or vomiting.  You have drainage other than breast milk from your nipples.  Your breasts do not become full before feedings by the fifth day after you give birth.  You feel sad and depressed.  Your baby is too sleepy to eat well.  Your baby is having trouble sleeping.  Your baby is wetting less than 3 diapers in a 24-hour period.  Your baby has less than 3 stools in a 24-hour period.  Your baby's skin or the white part of his or her eyes becomes yellow.  Your baby is not gaining weight by 5 days of age. Get help right away if:  Your baby is overly tired (lethargic) and does not want to wake up and feed.  Your baby develops an unexplained fever. This information is not intended to replace advice given to you by your health care provider. Make sure you discuss any questions you have with your health care provider. Document Released: 03/22/2005 Document Revised: 09/03/2015 Document Reviewed: 09/13/2012 Elsevier Interactive Patient Education  2017 Elsevier Inc.  

## 2016-06-02 ENCOUNTER — Encounter: Payer: Self-pay | Admitting: Obstetrics and Gynecology

## 2016-06-02 ENCOUNTER — Ambulatory Visit (INDEPENDENT_AMBULATORY_CARE_PROVIDER_SITE_OTHER): Payer: Non-veteran care | Admitting: Obstetrics and Gynecology

## 2016-06-02 VITALS — BP 105/69 | HR 85 | Wt 200.0 lb

## 2016-06-02 DIAGNOSIS — R8789 Other abnormal findings in specimens from female genital organs: Secondary | ICD-10-CM

## 2016-06-02 DIAGNOSIS — O34219 Maternal care for unspecified type scar from previous cesarean delivery: Secondary | ICD-10-CM

## 2016-06-02 DIAGNOSIS — R87618 Other abnormal cytological findings on specimens from cervix uteri: Secondary | ICD-10-CM

## 2016-06-02 DIAGNOSIS — O0990 Supervision of high risk pregnancy, unspecified, unspecified trimester: Secondary | ICD-10-CM

## 2016-06-02 DIAGNOSIS — O09521 Supervision of elderly multigravida, first trimester: Secondary | ICD-10-CM

## 2016-06-02 NOTE — Progress Notes (Signed)
Prenatal Visit Note Date: 06/02/2016 Clinic: [facility]  Subjective:  EMELINDA MARBLE is a 36 y.o. G3P2002 at [redacted]w[redacted]d being seen today for ongoing prenatal care.  She is currently monitored for the following issues for this high-risk pregnancy and has Mass of perirectal soft tissue - prob seb cyst; Disturbance of skin sensation; Supervision of high risk pregnancy, antepartum; AMA (advanced maternal age) multigravida 35+, first trimester; Previous cesarean section complicating pregnancy; and Pap smear abnormality of cervix/human papillomavirus (HPV) positive on her problem list.  Patient reports no complaints.   Contractions: Irregular. Vag. Bleeding: None.  Movement: Present. Denies leaking of fluid.   The following portions of the patient's history were reviewed and updated as appropriate: allergies, current medications, past family history, past medical history, past social history, past surgical history and problem list. Problem list updated.  Objective:   Vitals:   06/02/16 1041  BP: 105/69  Pulse: 85  Weight: 200 lb (90.7 kg)    Fetal Status: Fetal Heart Rate (bpm): 145 Fundal Height: 33 cm Movement: Present  Presentation: Vertex  General:  Alert, oriented and cooperative. Patient is in no acute distress.  Skin: Skin is warm and dry. No rash noted.   Cardiovascular: Normal heart rate noted  Respiratory: Normal respiratory effort, no problems with respiration noted  Abdomen: Soft, gravid, appropriate for gestational age. Pain/Pressure: Present     Pelvic:  Cervical exam deferred        Extremities: Normal range of motion.  Edema: Trace  Mental Status: Normal mood and affect. Normal behavior. Normal judgment and thought content.   Urinalysis:      Assessment and Plan:  Pregnancy: G3P2002 at [redacted]w[redacted]d  1. Supervision of high risk pregnancy, antepartum Routine care. BTL papers already signed  2. Previous cesarean section complicating pregnancy Desires  TOLAc. Consent signed.   3. AMA (advanced maternal age) multigravida 14+, first trimester No change in plan of care.    Preterm labor symptoms and general obstetric precautions including but not limited to vaginal bleeding, contractions, leaking of fluid and fetal movement were reviewed in detail with the patient. Please refer to After Visit Summary for other counseling recommendations.  Return in about 2 weeks (around 06/16/2016).   Aletha Halim, MD

## 2016-06-17 ENCOUNTER — Ambulatory Visit (INDEPENDENT_AMBULATORY_CARE_PROVIDER_SITE_OTHER): Payer: Non-veteran care | Admitting: Obstetrics & Gynecology

## 2016-06-17 VITALS — BP 98/65 | HR 74 | Wt 199.0 lb

## 2016-06-17 DIAGNOSIS — O34219 Maternal care for unspecified type scar from previous cesarean delivery: Secondary | ICD-10-CM

## 2016-06-17 DIAGNOSIS — O09523 Supervision of elderly multigravida, third trimester: Secondary | ICD-10-CM

## 2016-06-17 DIAGNOSIS — O0990 Supervision of high risk pregnancy, unspecified, unspecified trimester: Secondary | ICD-10-CM

## 2016-06-17 DIAGNOSIS — O09521 Supervision of elderly multigravida, first trimester: Secondary | ICD-10-CM

## 2016-06-17 NOTE — Patient Instructions (Signed)
Return to clinic for any scheduled appointments or obstetric concerns, or go to MAU for evaluation  

## 2016-06-17 NOTE — Progress Notes (Signed)
   PRENATAL VISIT NOTE  Subjective:  Brittany Vasquez is a 36 y.o. G3P2002 at [redacted]w[redacted]d being seen today for ongoing prenatal care.  She is currently monitored for the following issues for this high-risk pregnancy and has Mass of perirectal soft tissue - prob seb cyst; Disturbance of skin sensation; Supervision of high risk pregnancy, antepartum; AMA (advanced maternal age) multigravida 35+, first trimester; Previous cesarean section complicating pregnancy; and Pap smear abnormality of cervix/human papillomavirus (HPV) positive on her problem list.  Patient reports no complaints.  Contractions: Irregular. Vag. Bleeding: None.  Movement: Present. Denies leaking of fluid.   The following portions of the patient's history were reviewed and updated as appropriate: allergies, current medications, past family history, past medical history, past social history, past surgical history and problem list. Problem list updated.  Objective:   Vitals:   06/17/16 0835  BP: 98/65  Pulse: 74  Weight: 199 lb (90.3 kg)    Fetal Status: Fetal Heart Rate (bpm): 138 Fundal Height: 36 cm Movement: Present     General:  Alert, oriented and cooperative. Patient is in no acute distress.  Skin: Skin is warm and dry. No rash noted.   Cardiovascular: Normal heart rate noted  Respiratory: Normal respiratory effort, no problems with respiration noted  Abdomen: Soft, gravid, appropriate for gestational age. Pain/Pressure: Present     Pelvic:  Cervical exam deferred        Extremities: Normal range of motion.  Edema: Trace  Mental Status: Normal mood and affect. Normal behavior. Normal judgment and thought content.   Assessment and Plan:  Pregnancy: G3P2002 at [redacted]w[redacted]d  1. AMA (advanced maternal age) multigravida 34+, first trimester  2. Previous cesarean section complicating pregnancy Getting TOLAC  3. Supervision of high risk pregnancy, antepartum Preterm labor symptoms and general obstetric precautions including  but not limited to vaginal bleeding, contractions, leaking of fluid and fetal movement were reviewed in detail with the patient. Please refer to After Visit Summary for other counseling recommendations.  Return in about 1 week (around 06/24/2016) for OB Visit, Pelvic cultures, U/S for presentation .   Osborne Oman, MD

## 2016-06-23 ENCOUNTER — Ambulatory Visit (INDEPENDENT_AMBULATORY_CARE_PROVIDER_SITE_OTHER): Payer: Non-veteran care | Admitting: Obstetrics & Gynecology

## 2016-06-23 VITALS — BP 114/65 | HR 67 | Wt 201.0 lb

## 2016-06-23 DIAGNOSIS — Z3689 Encounter for other specified antenatal screening: Secondary | ICD-10-CM

## 2016-06-23 DIAGNOSIS — O09521 Supervision of elderly multigravida, first trimester: Secondary | ICD-10-CM

## 2016-06-23 DIAGNOSIS — Z113 Encounter for screening for infections with a predominantly sexual mode of transmission: Secondary | ICD-10-CM

## 2016-06-23 DIAGNOSIS — O09523 Supervision of elderly multigravida, third trimester: Secondary | ICD-10-CM

## 2016-06-23 DIAGNOSIS — O0990 Supervision of high risk pregnancy, unspecified, unspecified trimester: Secondary | ICD-10-CM

## 2016-06-23 LAB — OB RESULTS CONSOLE GBS: STREP GROUP B AG: NEGATIVE

## 2016-06-23 NOTE — Patient Instructions (Signed)
Return to clinic for any scheduled appointments or obstetric concerns, or go to MAU for evaluation  

## 2016-06-23 NOTE — Progress Notes (Signed)
   PRENATAL VISIT NOTE  Subjective:  Brittany Vasquez is a 36 y.o. G3P2002 at [redacted]w[redacted]d being seen today for ongoing prenatal care.  She is currently monitored for the following issues for this high-risk pregnancy and has Mass of perirectal soft tissue - prob seb cyst; Disturbance of skin sensation; Supervision of high risk pregnancy, antepartum; AMA (advanced maternal age) multigravida 35+, first trimester; Previous cesarean section complicating pregnancy; and Pap smear abnormality of cervix/human papillomavirus (HPV) positive on her problem list.  Patient reports no complaints.  Contractions: Irregular. Vag. Bleeding: None.  Movement: Present. Denies leaking of fluid.   The following portions of the patient's history were reviewed and updated as appropriate: allergies, current medications, past family history, past medical history, past social history, past surgical history and problem list. Problem list updated.  Objective:   Vitals:   06/23/16 1526 06/23/16 1540  BP: 114/65 114/65  Pulse: 67 67  Weight: 201 lb (91.2 kg) 201 lb (91.2 kg)    Fetal Status: Fetal Heart Rate (bpm): 138 Fundal Height: 37 cm Movement: Present  Presentation: Vertex  General:  Alert, oriented and cooperative. Patient is in no acute distress.  Skin: Skin is warm and dry. No rash noted.   Cardiovascular: Normal heart rate noted  Respiratory: Normal respiratory effort, no problems with respiration noted  Abdomen: Soft, gravid, appropriate for gestational age. Pain/Pressure: Present     Pelvic:  Cervical exam deferred by patient's preference        Extremities: Normal range of motion.  Edema: Trace  Mental Status: Normal mood and affect. Normal behavior. Normal judgment and thought content.   Assessment and Plan:  Pregnancy: G3P2002 at [redacted]w[redacted]d  1. AMA (advanced maternal age) multigravida 72+, first trimester  2. Supervision of high risk pregnancy, antepartum Pelvic cultures done today. Presentation verified  on ultrasound.  - [place] bedside; Future - GC/Chlamydia probe amp ([place])not at [facility] - Strep Gp B NAA  Preterm labor symptoms and general obstetric precautions including but not limited to vaginal bleeding, contractions, leaking of fluid and fetal movement were reviewed in detail with the patient. Please refer to After Visit Summary for other counseling recommendations.  Return in about 1 week (around 06/30/2016) for OB Visit.   Osborne Oman, MD

## 2016-06-23 NOTE — Progress Notes (Signed)
Bedside US shows vertex presentation and 138 FHR

## 2016-06-24 ENCOUNTER — Encounter (HOSPITAL_COMMUNITY): Payer: Self-pay

## 2016-06-24 ENCOUNTER — Inpatient Hospital Stay (HOSPITAL_COMMUNITY)
Admission: AD | Admit: 2016-06-24 | Discharge: 2016-06-24 | Disposition: A | Discharge status: Home / Self Care | Payer: Non-veteran care | Source: Ambulatory Visit | Attending: Obstetrics & Gynecology | Admitting: Obstetrics & Gynecology

## 2016-06-24 DIAGNOSIS — W109XXA Fall (on) (from) unspecified stairs and steps, initial encounter: Secondary | ICD-10-CM | POA: Insufficient documentation

## 2016-06-24 DIAGNOSIS — Z3A36 36 weeks gestation of pregnancy: Secondary | ICD-10-CM | POA: Insufficient documentation

## 2016-06-24 DIAGNOSIS — O26893 Other specified pregnancy related conditions, third trimester: Secondary | ICD-10-CM | POA: Insufficient documentation

## 2016-06-24 DIAGNOSIS — Z3689 Encounter for other specified antenatal screening: Secondary | ICD-10-CM

## 2016-06-24 LAB — GC/CHLAMYDIA PROBE AMP (~~LOC~~) NOT AT ARMC
CHLAMYDIA, DNA PROBE: NEGATIVE
NEISSERIA GONORRHEA: NEGATIVE

## 2016-06-24 NOTE — MAU Note (Signed)
Patient states that she was walking down the stairs and fell and slid down on her back. Patient states that it was down 6 stairs and she has been having intermittent cramping ever since. Patient denies bleeding or LOF. Fetus active.

## 2016-06-24 NOTE — MAU Provider Note (Signed)
  History     CSN: [id number]  Arrival date and time: 06/24/16 1618   First Provider Initiated Contact with Patient 06/24/16 1659      Chief Complaint  Patient presents with  . Fall   G3P2002 @36 .5 weeks here after a fall. She slipped going down the indoor stairs about 3 hrs ago. She landed on her buttocks and right back. She did not have abdominal contact. She reports good FM. No VB or LOF. She has irregular ctx as usual for her.     OB History    Gravida Para Term Preterm AB Living   3 2 2  0 0 2   SAB TAB Ectopic Multiple Live Births   0 0 0 0 2      Past Medical History:  Diagnosis Date  . Fibromyalgia   . Ulcerative colitis [facility])     Past Surgical History:  Procedure Laterality Date  . CESAREAN SECTION  01/27/2012   Procedure: CESAREAN SECTION;  Surgeon: Thurnell Lose, MD;  Location: [facility];  Service: Obstetrics;  Laterality: N/A;  Primary Cesarean Section with birth of baby boy @ 54    Family History  Problem Relation Age of Onset  . Breast cancer Mother 28    breast cancer returned again at age 12  . Brain cancer Maternal Aunt 39  . Breast cancer Maternal Grandmother 80  . Stroke Paternal Grandfather     Social History  Substance Use Topics  . Smoking status: Never Smoker  . Smokeless tobacco: Never Used  . Alcohol use No    Allergies: No Known Allergies  Prescriptions Prior to Admission  Medication Sig Dispense Refill Last Dose  . cholecalciferol (VITAMIN D) 1000 units tablet Take 2,000 Units by mouth daily.   Taking  . omeprazole (PRILOSEC) 20 MG capsule Take 1 capsule (20 mg total) by mouth daily. 30 capsule 3 Taking  . Prenatal Vit-Fe Fumarate-FA (PRENATAL MULTIVITAMIN) TABS Take 1 tablet by mouth every morning.   Taking    Review of Systems  Gastrointestinal: Negative for abdominal pain.  Genitourinary: Negative for vaginal bleeding.  Musculoskeletal: Positive for back pain.   Physical Exam   Blood pressure 117/67, pulse 69,  temperature 97.5 F (36.4 C), temperature source Oral, resp. rate 16, last menstrual period 10/11/2015.  Physical Exam  Nursing note and vitals reviewed. Constitutional: She is oriented to person, place, and time. She appears well-developed and well-nourished. No distress.  HENT:  Head: Normocephalic and atraumatic.  Neck: Normal range of motion.  Cardiovascular: Normal rate.   Respiratory: Effort normal.  GI: Soft. She exhibits no distension. There is no tenderness.  gravid  Musculoskeletal: Normal range of motion.  Neurological: She is alert and oriented to person, place, and time.  Skin: Skin is warm and dry.  Psychiatric: She has a normal mood and affect.   EFM: 140 bpm, mod variability, + accels, no decels Toco: irregular   MAU Course  Procedures Prolonged EFM Heating pad  MDM Transfer of care given to K. Marcie Mowers, CNM  06/24/2016 7:22 PM     Assessment and Plan   1. NST (non-stress test) reactive   NST 150 bpm with moderate variability, present acel, neg decl, occasioanl contractions (normal for her). Patient reports good fetal movement, deies bleeding or leaking of fluid.  2. Patient stable for discharge; reviewed when to return to the MAU. Patient verbalized understanding.   Maye Hides CNM

## 2016-06-24 NOTE — Discharge Instructions (Signed)

## 2016-06-25 LAB — STREP GP B NAA: STREP GROUP B AG: NEGATIVE

## 2016-06-30 ENCOUNTER — Encounter: Payer: Self-pay | Admitting: Obstetrics and Gynecology

## 2016-06-30 ENCOUNTER — Ambulatory Visit (INDEPENDENT_AMBULATORY_CARE_PROVIDER_SITE_OTHER): Payer: Non-veteran care | Admitting: Obstetrics and Gynecology

## 2016-06-30 VITALS — BP 103/65 | HR 82 | Wt 199.0 lb

## 2016-06-30 DIAGNOSIS — O34219 Maternal care for unspecified type scar from previous cesarean delivery: Secondary | ICD-10-CM

## 2016-06-30 DIAGNOSIS — O09521 Supervision of elderly multigravida, first trimester: Secondary | ICD-10-CM

## 2016-06-30 DIAGNOSIS — O0990 Supervision of high risk pregnancy, unspecified, unspecified trimester: Secondary | ICD-10-CM

## 2016-06-30 DIAGNOSIS — O09523 Supervision of elderly multigravida, third trimester: Secondary | ICD-10-CM

## 2016-06-30 NOTE — Progress Notes (Signed)
Prenatal Visit Note Date: 06/30/2016 Clinic: [facility]  Subjective:  Brittany Vasquez is a 36 y.o. G3P2002 at [redacted]w[redacted]d being seen today for ongoing prenatal care.  She is currently monitored for the following issues for this high-risk pregnancy and has Mass of perirectal soft tissue - prob seb cyst; Disturbance of skin sensation; Supervision of high risk pregnancy, antepartum; AMA (advanced maternal age) multigravida 35+, first trimester; Previous cesarean section complicating pregnancy; and Pap smear abnormality of cervix/human papillomavirus (HPV) positive on her problem list.  Patient reports no complaints.   Contractions: Irregular. Vag. Bleeding: None.  Movement: Present. Denies leaking of fluid.   The following portions of the patient's history were reviewed and updated as appropriate: allergies, current medications, past family history, past medical history, past social history, past surgical history and problem list. Problem list updated.  Objective:   Vitals:   06/30/16 1012  BP: 103/65  Pulse: 82  Weight: 199 lb (90.3 kg)    Fetal Status: Fetal Heart Rate (bpm): 140 Fundal Height: 38 cm Movement: Present  Presentation: Vertex  General:  Alert, oriented and cooperative. Patient is in no acute distress.  Skin: Skin is warm and dry. No rash noted.   Cardiovascular: Normal heart rate noted  Respiratory: Normal respiratory effort, no problems with respiration noted  Abdomen: Soft, gravid, appropriate for gestational age. Pain/Pressure: Present     Pelvic:  Cervical exam deferred        Extremities: Normal range of motion.  Edema: Trace  Mental Status: Normal mood and affect. Normal behavior. Normal judgment and thought content.   Urinalysis:      Assessment and Plan:  Pregnancy: G3P2002 at [redacted]w[redacted]d  1. Supervision of high risk pregnancy, antepartum Routine care. Desires BTL (consent already signed)   2. Previous cesarean section complicating  pregnancy Op note reviewed. Pt still desires TOLAC  3. AMA (advanced maternal age) multigravida 83+, first trimester No change in plan of care.   Term labor symptoms and general obstetric precautions including but not limited to vaginal bleeding, contractions, leaking of fluid and fetal movement were reviewed in detail with the patient. Please refer to After Visit Summary for other counseling recommendations.  Return in about 1 week (around 07/07/2016).   Aletha Halim, MD

## 2016-07-06 ENCOUNTER — Ambulatory Visit (INDEPENDENT_AMBULATORY_CARE_PROVIDER_SITE_OTHER): Payer: Non-veteran care | Admitting: Obstetrics & Gynecology

## 2016-07-06 ENCOUNTER — Encounter: Payer: Self-pay | Admitting: Obstetrics & Gynecology

## 2016-07-06 VITALS — BP 112/71 | HR 66 | Wt 201.0 lb

## 2016-07-06 DIAGNOSIS — O34219 Maternal care for unspecified type scar from previous cesarean delivery: Secondary | ICD-10-CM

## 2016-07-06 DIAGNOSIS — O0990 Supervision of high risk pregnancy, unspecified, unspecified trimester: Secondary | ICD-10-CM

## 2016-07-06 DIAGNOSIS — O99613 Diseases of the digestive system complicating pregnancy, third trimester: Secondary | ICD-10-CM

## 2016-07-06 DIAGNOSIS — O09521 Supervision of elderly multigravida, first trimester: Secondary | ICD-10-CM

## 2016-07-06 DIAGNOSIS — O09523 Supervision of elderly multigravida, third trimester: Secondary | ICD-10-CM

## 2016-07-06 DIAGNOSIS — K219 Gastro-esophageal reflux disease without esophagitis: Secondary | ICD-10-CM

## 2016-07-06 MED ORDER — OMEPRAZOLE 20 MG PO CPDR: 20.0000 mg | DELAYED_RELEASE_CAPSULE | Freq: Every day | ORAL | 2 refills | Status: DC

## 2016-07-06 NOTE — Progress Notes (Signed)
   PRENATAL VISIT NOTE  Subjective:  Brittany Vasquez is a 36 y.o. G3P2002 at [redacted]w[redacted]d being seen today for ongoing prenatal care.  She is currently monitored for the following issues for this high-risk pregnancy and has Supervision of high risk pregnancy, antepartum; AMA (advanced maternal age) multigravida 35+, first trimester; Previous cesarean section complicating pregnancy; and Pap smear abnormality of cervix/human papillomavirus (HPV) positive on her problem list.  Patient reports no complaints.  Contractions: Irregular. Vag. Bleeding: None.  Movement: Present. Denies leaking of fluid.   The following portions of the patient's history were reviewed and updated as appropriate: allergies, current medications, past family history, past medical history, past social history, past surgical history and problem list. Problem list updated.  Objective:   Vitals:   07/06/16 1352  BP: 112/71  Pulse: 66  Weight: 201 lb (91.2 kg)    Fetal Status: Fetal Heart Rate (bpm): 136 Fundal Height: 38 cm Movement: Present     General:  Alert, oriented and cooperative. Patient is in no acute distress.  Skin: Skin is warm and dry. No rash noted.   Cardiovascular: Normal heart rate noted  Respiratory: Normal respiratory effort, no problems with respiration noted  Abdomen: Soft, gravid, appropriate for gestational age. Pain/Pressure: Present     Pelvic:  Cervical exam deferred        Extremities: Normal range of motion.  Edema: Trace  Mental Status: Normal mood and affect. Normal behavior. Normal judgment and thought content.   Assessment and Plan:  Pregnancy: G3P2002 at [redacted]w[redacted]d  1. Gastroesophageal reflux disease without esophagitis Medication refilled as per patient request - omeprazole (PRILOSEC) 20 MG capsule; Take 1 capsule (20 mg total) by mouth daily.  Dispense: 90 capsule; Refill: 2  2. AMA (advanced maternal age) multigravida 42+, first trimester  3. Previous cesarean section complicating  pregnancy Desires TOLAC; wants to wait until 42 weeks for spontaneous labor. Advised that our usual practice was to do IOL at 41 weeks, but she can go to 42 weeks if she is stable and fetal antenatal testing is reassuring.  Patient wants this plan.  4. Supervision of high risk pregnancy, antepartum Term labor symptoms and general obstetric precautions including but not limited to vaginal bleeding, contractions, leaking of fluid and fetal movement were reviewed in detail with the patient. Please refer to After Visit Summary for other counseling recommendations.  Return in about 1 week (around 07/13/2016) for OB Visit.   Osborne Oman, MD

## 2016-07-06 NOTE — Patient Instructions (Addendum)
Return to clinic for any scheduled appointments or obstetric concerns, or go to MAU for evaluation   Nitrous oxide for analgesia is available at [facility]

## 2016-07-08 DIAGNOSIS — T61771A Other fish poisoning, accidental (unintentional), initial encounter: Secondary | ICD-10-CM | POA: Diagnosis not present

## 2016-07-09 ENCOUNTER — Inpatient Hospital Stay (HOSPITAL_COMMUNITY)
Admission: AD | Admit: 2016-07-09 | Discharge: 2016-07-11 | DRG: 774 | Disposition: A | Discharge status: Home / Self Care | Payer: Non-veteran care | Source: Ambulatory Visit | Attending: Obstetrics & Gynecology | Admitting: Obstetrics & Gynecology

## 2016-07-09 ENCOUNTER — Encounter (HOSPITAL_COMMUNITY): Payer: Self-pay | Admitting: *Deleted

## 2016-07-09 DIAGNOSIS — R87618 Other abnormal cytological findings on specimens from cervix uteri: Secondary | ICD-10-CM | POA: Diagnosis present

## 2016-07-09 DIAGNOSIS — Z3493 Encounter for supervision of normal pregnancy, unspecified, third trimester: Secondary | ICD-10-CM | POA: Diagnosis present

## 2016-07-09 DIAGNOSIS — Z823 Family history of stroke: Secondary | ICD-10-CM

## 2016-07-09 DIAGNOSIS — R8789 Other abnormal findings in specimens from female genital organs: Secondary | ICD-10-CM | POA: Diagnosis present

## 2016-07-09 DIAGNOSIS — R87820 Cervical low risk human papillomavirus (HPV) DNA test positive: Secondary | ICD-10-CM | POA: Diagnosis present

## 2016-07-09 DIAGNOSIS — O34211 Maternal care for low transverse scar from previous cesarean delivery: Principal | ICD-10-CM | POA: Diagnosis present

## 2016-07-09 DIAGNOSIS — Z3A38 38 weeks gestation of pregnancy: Secondary | ICD-10-CM | POA: Diagnosis not present

## 2016-07-09 DIAGNOSIS — O34219 Maternal care for unspecified type scar from previous cesarean delivery: Secondary | ICD-10-CM

## 2016-07-09 DIAGNOSIS — O9852 Other viral diseases complicating childbirth: Secondary | ICD-10-CM | POA: Diagnosis present

## 2016-07-09 DIAGNOSIS — O09521 Supervision of elderly multigravida, first trimester: Secondary | ICD-10-CM | POA: Diagnosis present

## 2016-07-09 LAB — CBC
HEMATOCRIT: 35.3 % — AB (ref 36.0–46.0)
Hemoglobin: 11.6 g/dL — ABNORMAL LOW (ref 12.0–15.0)
MCH: 26.4 pg (ref 26.0–34.0)
MCHC: 32.9 g/dL (ref 30.0–36.0)
MCV: 80.2 fL (ref 78.0–100.0)
PLATELETS: 183 10*3/uL (ref 150–400)
RBC: 4.4 MIL/uL (ref 3.87–5.11)
RDW: 15.5 % (ref 11.5–15.5)
WBC: 9.7 10*3/uL (ref 4.0–10.5)

## 2016-07-09 LAB — TYPE AND SCREEN
ABO/RH(D): B POS
Antibody Screen: NEGATIVE

## 2016-07-09 MED ORDER — ZOLPIDEM TARTRATE 5 MG PO TABS: 5.0000 mg | ORAL_TABLET | Freq: Every evening | ORAL | Status: DC | PRN

## 2016-07-09 MED ORDER — ACETAMINOPHEN 325 MG PO TABS: 650.0000 mg | ORAL_TABLET | ORAL | Status: DC | PRN

## 2016-07-09 MED ORDER — OXYTOCIN 40 UNITS IN LACTATED RINGERS INFUSION - SIMPLE MED
2.5000 [IU]/h | INTRAVENOUS | Status: DC
  Filled 2016-07-09: qty 1000

## 2016-07-09 MED ORDER — FLEET ENEMA 7-19 GM/118ML RE ENEM: 1.0000 | ENEMA | RECTAL | Status: DC | PRN

## 2016-07-09 MED ORDER — PRENATAL MULTIVITAMIN CH
1.0000 | ORAL_TABLET | Freq: Every day | ORAL | Status: DC
  Administered 2016-07-10: 1 via ORAL
  Filled 2016-07-09 (×2): qty 1

## 2016-07-09 MED ORDER — DIPHENHYDRAMINE HCL 25 MG PO CAPS: 25.0000 mg | ORAL_CAPSULE | Freq: Four times a day (QID) | ORAL | Status: DC | PRN

## 2016-07-09 MED ORDER — SODIUM CHLORIDE 0.9% FLUSH: 3.0000 mL | INTRAVENOUS | Status: DC | PRN

## 2016-07-09 MED ORDER — OXYCODONE-ACETAMINOPHEN 5-325 MG PO TABS: 1.0000 | ORAL_TABLET | ORAL | Status: DC | PRN

## 2016-07-09 MED ORDER — WITCH HAZEL-GLYCERIN EX PADS: 1.0000 "application " | MEDICATED_PAD | CUTANEOUS | Status: DC | PRN

## 2016-07-09 MED ORDER — LIDOCAINE HCL (PF) 1 % IJ SOLN
30.0000 mL | INTRAMUSCULAR | Status: DC | PRN
  Filled 2016-07-09: qty 30

## 2016-07-09 MED ORDER — ONDANSETRON HCL 4 MG/2ML IJ SOLN: 4.0000 mg | INTRAMUSCULAR | Status: DC | PRN

## 2016-07-09 MED ORDER — SOD CITRATE-CITRIC ACID 500-334 MG/5ML PO SOLN: 30.0000 mL | ORAL | Status: DC | PRN

## 2016-07-09 MED ORDER — SENNOSIDES-DOCUSATE SODIUM 8.6-50 MG PO TABS
2.0000 | ORAL_TABLET | ORAL | Status: DC
  Administered 2016-07-10: 2 via ORAL
  Filled 2016-07-09: qty 2

## 2016-07-09 MED ORDER — OXYCODONE HCL 5 MG PO TABS
5.0000 mg | ORAL_TABLET | Freq: Once | ORAL | Status: AC
  Administered 2016-07-09: 5 mg via ORAL
  Filled 2016-07-09: qty 1

## 2016-07-09 MED ORDER — COCONUT OIL OIL
1.0000 "application " | TOPICAL_OIL | Status: DC | PRN
  Administered 2016-07-09: 1 via TOPICAL
  Filled 2016-07-09: qty 120

## 2016-07-09 MED ORDER — LACTATED RINGERS IV SOLN: INTRAVENOUS | Status: DC

## 2016-07-09 MED ORDER — ACETAMINOPHEN 325 MG PO TABS
650.0000 mg | ORAL_TABLET | ORAL | Status: DC | PRN
  Administered 2016-07-09: 650 mg via ORAL
  Filled 2016-07-09: qty 2

## 2016-07-09 MED ORDER — LACTATED RINGERS IV SOLN: 500.0000 mL | INTRAVENOUS | Status: DC | PRN

## 2016-07-09 MED ORDER — SODIUM CHLORIDE 0.9% FLUSH: 3.0000 mL | Freq: Two times a day (BID) | INTRAVENOUS | Status: DC

## 2016-07-09 MED ORDER — OXYCODONE-ACETAMINOPHEN 5-325 MG PO TABS: 2.0000 | ORAL_TABLET | ORAL | Status: DC | PRN

## 2016-07-09 MED ORDER — BENZOCAINE-MENTHOL 20-0.5 % EX AERO
1.0000 "application " | INHALATION_SPRAY | CUTANEOUS | Status: DC | PRN
  Administered 2016-07-09: 1 via TOPICAL
  Filled 2016-07-09: qty 56

## 2016-07-09 MED ORDER — ONDANSETRON HCL 4 MG/2ML IJ SOLN: 4.0000 mg | Freq: Four times a day (QID) | INTRAMUSCULAR | Status: DC | PRN

## 2016-07-09 MED ORDER — IBUPROFEN 600 MG PO TABS
600.0000 mg | ORAL_TABLET | Freq: Four times a day (QID) | ORAL | Status: DC
  Administered 2016-07-09 – 2016-07-11 (×7): 600 mg via ORAL
  Filled 2016-07-09 (×8): qty 1

## 2016-07-09 MED ORDER — OXYTOCIN 10 UNIT/ML IJ SOLN
INTRAMUSCULAR | Status: AC
  Administered 2016-07-09: 10 [IU]
  Filled 2016-07-09: qty 1

## 2016-07-09 MED ORDER — DIBUCAINE 1 % RE OINT: 1.0000 "application " | TOPICAL_OINTMENT | RECTAL | Status: DC | PRN

## 2016-07-09 MED ORDER — SODIUM CHLORIDE 0.9 % IV SOLN: 250.0000 mL | INTRAVENOUS | Status: DC | PRN

## 2016-07-09 MED ORDER — OXYTOCIN 40 UNITS IN LACTATED RINGERS INFUSION - SIMPLE MED: 2.5000 [IU]/h | INTRAVENOUS | Status: DC | PRN

## 2016-07-09 MED ORDER — TETANUS-DIPHTH-ACELL PERTUSSIS 5-2.5-18.5 LF-MCG/0.5 IM SUSP: 0.5000 mL | Freq: Once | INTRAMUSCULAR | Status: DC

## 2016-07-09 MED ORDER — OXYTOCIN BOLUS FROM INFUSION: 500.0000 mL | Freq: Once | INTRAVENOUS | Status: DC

## 2016-07-09 MED ORDER — FENTANYL CITRATE (PF) 100 MCG/2ML IJ SOLN: 100.0000 ug | INTRAMUSCULAR | Status: DC | PRN

## 2016-07-09 MED ORDER — SIMETHICONE 80 MG PO CHEW: 80.0000 mg | CHEWABLE_TABLET | ORAL | Status: DC | PRN

## 2016-07-09 MED ORDER — ONDANSETRON HCL 4 MG PO TABS
4.0000 mg | ORAL_TABLET | ORAL | Status: DC | PRN
Start: 2016-07-09 — End: 2016-07-11

## 2016-07-09 NOTE — Lactation Note (Signed)
This note was copied from a baby's chart. Lactation Consultation Note  Patient Name: Brittany Vasquez [id number] Date: 07/09/2016 Reason for consult: Follow-up assessment  Follow up visit at 3 hours of age.  Mom reports baby latched for about 30 minutes and reports minimal discomfort.  LC encouraged mom to hold breast for compressions to keep baby active.  Baby released nipple slightly compressed and baby asleep.  LC discussed hand expression prior to latching, during and after feedings to apply to nipple.   LC assisted mom with pillow support and asked mom to call at next feeding as needed. Mom is experienced with older 2 children nursing for 2 years each.  Mom denies further concerns at this time.    Maternal Data Formula Feeding for Exclusion: No Has patient been taught Hand Expression?: Yes Does the patient have breastfeeding experience prior to this delivery?: Yes  Feeding Feeding Type: Breast Fed Length of feed: 30 min  LATCH Score/Interventions Latch: Grasps breast easily, tongue down, lips flanged, rhythmical sucking.  Audible Swallowing: None  Type of Nipple: Everted at rest and after stimulation  Comfort (Breast/Nipple): Filling, red/small blisters or bruises, mild/mod discomfort     Hold (Positioning): Assistance needed to correctly position infant at breast and maintain latch. Intervention(s): Breastfeeding basics reviewed;Skin to skin  LATCH Score: 6  Lactation Tools Discussed/Used     Consult Status Consult Status: Follow-up Date: 07/10/16 Follow-up type: In-patient    Justice Britain 07/09/2016, 5:44 PM

## 2016-07-09 NOTE — MAU Note (Signed)
Pt presents to MAU with complaints of contractions that started around 7 this morning Denies any VB or LOF.

## 2016-07-09 NOTE — Lactation Note (Signed)
This note was copied from a baby's chart. Lactation Consultation Note  Patient Name: Brittany Vasquez Today's Date: 07/09/2016 Reason for consult: Initial assessment Baby at 1 hr of life and in CN. Mom was preparing to move from L&D to [facility] She bf her other children but had "cracked bleeding nipples for th first 2wk". She would like to avoid the nipple pain with this baby and requested that lactation check the latch at the next feeding. Discussed baby behavior, feeding frequency, baby belly size, voids, wt loss, breast changes, and nipple care. Mom stated she can manually express. She has a "nipple butter" with her, suggested she use coconut oil instead if she has any soreness. Given lactation handouts. Aware of OP services and support group.    Maternal Data Formula Feeding for Exclusion: No Has patient been taught Hand Expression?: Yes Does the patient have breastfeeding experience prior to this delivery?: Yes  Feeding    LATCH Score/Interventions                      Lactation Tools Discussed/Used     Consult Status Consult Status: Follow-up Date: 07/09/16 Follow-up type: In-patient    Denzil Hughes 07/09/2016, 2:56 PM

## 2016-07-09 NOTE — Progress Notes (Signed)
Dr Vanetta Shawl notified of pt's change in VE, orders received

## 2016-07-09 NOTE — H&P (Signed)
LABOR AND DELIVERY ADMISSION HISTORY AND PHYSICAL NOTE  Brittany Vasquez is a 36 y.o. female G16P2002 with IUP at [redacted]w[redacted]d by LMP c/w 11 wk [place] presenting for normal labor.   She reports positive fetal movement. She denies leakage of fluid or vaginal bleeding.  Prenatal History/Complications: Previous C/S; AMA  Past Medical History: Past Medical History:  Diagnosis Date  . Fibromyalgia   . Mass of perirectal soft tissue - prob seb cyst 05/22/2012  . Ulcerative colitis [facility])     Past Surgical History: Past Surgical History:  Procedure Laterality Date  . CESAREAN SECTION  01/27/2012   Procedure: CESAREAN SECTION;  Surgeon: Thurnell Lose, MD;  Location: [place] [facility];  Service: Obstetrics;  Laterality: N/A;  Primary Cesarean Section with birth of baby boy @ 66    Obstetrical History: OB History    Gravida Para Term Preterm AB Living   3 2 2  0 0 2   SAB TAB Ectopic Multiple Live Births   0 0 0 0 2      Social History: Social History   Social History  . Marital status: Married    Spouse name: N/A  . Number of children: 2  . Years of education: N/A   Social History Main Topics  . Smoking status: Never Smoker  . Smokeless tobacco: Never Used  . Alcohol use No  . Drug use: No  . Sexual activity: Yes    Birth control/ protection: None   Other Topics Concern  . None   Social History Narrative   Patient lives at home with her Fiance. Patient has a B.S. Degree and has 2 children. Patient denies any tobacco, alcohol, and illicit drug use.     Family History: Family History  Problem Relation Age of Onset  . Breast cancer Mother 69    breast cancer returned again at age 66  . Brain cancer Maternal Aunt 39  . Breast cancer Maternal Grandmother 80  . Stroke Paternal Grandfather     Allergies: No Known Allergies  Prescriptions Prior to Admission  Medication Sig Dispense Refill Last Dose  . calcium carbonate (TUMS - DOSED IN MG ELEMENTAL CALCIUM) 500 MG chewable tablet  Chew 2 tablets by mouth daily as needed for indigestion or heartburn.   07/09/2016 at Unknown time  . Cholecalciferol (VITAMIN D) 2000 units CAPS Take 1 capsule by mouth daily.   07/08/2016 at Unknown time  . Prenatal Vit-Fe Fumarate-FA (PRENATAL MULTIVITAMIN) TABS Take 1 tablet by mouth every morning.   07/08/2016 at Unknown time  . omeprazole (PRILOSEC) 20 MG capsule Take 1 capsule (20 mg total) by mouth daily. (Patient not taking: Reported on 07/09/2016) 90 capsule 2 Not Taking at Unknown time     Review of Systems   All systems reviewed and negative except as stated in HPI  Physical Exam Blood pressure 118/68, pulse (!) 53, temperature 97.9 F (36.6 C), temperature source Oral, resp. rate 20, height 5\' 7"  (1.702 m), weight 201 lb (91.2 kg), last menstrual period 10/11/2015.  General appearance: alert, cooperative, appears stated age and no distress Lungs: clear to auscultation bilaterally Heart: regular rate and rhythm Abdomen: soft, non-tender; bowel sounds normal Extremities: No calf swelling or tenderness Presentation: cephalic Fetal monitoring: appeared Cat I, patient walking and moving around, difficult to monitor Uterine activity: q2-31min Dilation: 6 Effacement (%): 90 Station: 0 Exam by:: Dr. Vanetta Shawl   Prenatal labs: ABO, Rh: B/POS/-- (09/26 1420) Antibody: NEG (09/26 1420) Rubella: !Error! IMMUNE RPR: NON REAC (01/22 0001)  HBsAg: NEGATIVE (09/26 1420)  HIV: NONREACTIVE (01/22 0001)  GBS: Negative (03/21 1536)  2 hr Glucola: 81/130/122 Genetic screening:  First trim normal Anatomy US: Right choroid plexus cyst- resolved; LVEF, RVOT unable to be seen  Prenatal Transfer Tool  Maternal Diabetes: No Genetic Screening: Normal Maternal Ultrasounds/Referrals: Abnormal:  Findings:   Isolated EIF (echogenic intracardiac focus), Other: RVOT unable to be visualized Fetal Ultrasounds or other Referrals:  None Maternal Substance Abuse:  No Significant Maternal Medications:   None Significant Maternal Lab Results: Lab values include: Group B Strep negative  No results found for this or any previous visit (from the past 24 hour(s)).  Patient Active Problem List   Diagnosis Date Noted  . Normal labor 07/09/2016  . Pap smear abnormality of cervix/human papillomavirus (HPV) positive 02/04/2016  . Previous cesarean section complicating pregnancy 01/01/5746  . AMA (advanced maternal age) multigravida 1+, first trimester 01/07/2016  . Supervision of high risk pregnancy, antepartum 12/30/2015    Assessment: Brittany Vasquez is a 36 y.o. G3P2002 at [redacted]w[redacted]d here for SOL, TOLAC.  #Labor:TOLAC, SOL, minimal interventions requested #Pain: Doula present #FWB: Appeared Cat I, but poor tracing due to patient walking/moving around during contractions #ID:  GBS Neg #MOF: Breast #MOC:Nexplanon if no cesarean (if cesarean, perform BTL) #Circ:  N/A - girl #AMA  Brittany Basset, DO [place] for [facility], [facility] 07/09/2016, 1:39 PM

## 2016-07-09 NOTE — Consult Note (Signed)
Neonatology Note:  Attendance at Code Apgar:   Our team responded to a Code Apgar call to room # 168 following precipitous NSVD, due to infant with apnea. The requesting provider was Dr.  Vanetta Shawl. The mother is a G3P2 B pos, GBS neg with previous C-section, AMA. ROM occurred just  PTD and the fluid was meconium-stained.  At delivery, the baby was noted to be vigorous and delayed cord clamping was done. At about 3.5 minutes, the baby became apneic. The OB nursing staff in attendance gave vigorous stimulation and a Code Apgar was called. They suctioned, getting dark green meconium out, and gave PPV for about 1 minute. Hr was noted to be < 90 when PPV started, but recovered quickly. Our team arrived at 6 minutes of life, at which time the baby was crying and becoming vigorous again. However, she was having nasal flaring, mild subcostal retractions, and O2 sats were about 75% in room air, so BBO2 was given for several minutes. Her lungs were clear to ausc. After 15 minutes, she was able to maintain O2 sats of 87-90% in room air and was less distressed, but I did not feel she should remain for skin to skin time, so we transported her to the CN for observation during transition. Ap 7/4/9.  I spoke with the parents in the DR to reassure them.  Real Cons, MD

## 2016-07-10 DIAGNOSIS — O34219 Maternal care for unspecified type scar from previous cesarean delivery: Secondary | ICD-10-CM

## 2016-07-10 LAB — RPR: RPR: NONREACTIVE

## 2016-07-10 NOTE — Progress Notes (Signed)
Post Partum Day 1 Subjective: no complaints, up ad lib, voiding and tolerating PO  Objective: Blood pressure 104/64, pulse 60, temperature 97.7 F (36.5 C), temperature source Oral, resp. rate 18, height 5\' 7"  (1.702 m), weight 201 lb (91.2 kg), last menstrual period 10/11/2015, SpO2 99 %, unknown if currently breastfeeding.  Physical Exam:  General: alert, cooperative and no distress Lochia: appropriate Uterine Fundus: firm Incision: healing well DVT Evaluation: No evidence of DVT seen on physical exam.   Recent Labs  07/09/16 1536  HGB 11.6*  HCT 35.3*    Assessment/Plan: Plan for discharge tomorrow and Breastfeeding   LOS: 1 day   Brittany Vasquez 07/10/2016, 8:48 AM

## 2016-07-10 NOTE — Lactation Note (Signed)
This note was copied from a baby's chart. Lactation Consultation Note  MOB continues to have SN. Many attempts made at adjusting latch without success. Baby also becomes sleepy at the breast. Encouraged breast compression to aid in transfer. Mother to continue working with baby. Nipples noted to be abraded at the tips. Comfort gels to SN. Follow-up tomorrow. Patient Name: Brittany Vasquez [id number] Date: 07/10/2016     Maternal Data    Feeding Feeding Type: Breast Milk Length of feed: 13 min  LATCH Score/Interventions                      Lactation Tools Discussed/Used     Consult Status      Van Clines 07/10/2016, 3:59 PM

## 2016-07-10 NOTE — Discharge Summary (Signed)
OB Discharge Summary     Patient Name: Brittany Vasquez DOB: Aug 27, 1980 MRN: [id number]  Date of admission: 07/09/2016 Delivering MD: Isaias Sakai   Date of discharge: 07/11/2016  Admitting diagnosis: 39 WKS ACTIVE LABOR Intrauterine pregnancy: [redacted]w[redacted]d     Secondary diagnosis:  Principal Problem:   VBAC (vaginal birth after Cesarean) Active Problems:   AMA (advanced maternal age) multigravida 3+, first trimester   Pap smear abnormality of cervix/human papillomavirus (HPV) positive   Normal labor  Additional problems: none     Discharge diagnosis: Term Pregnancy Delivered                                                                                           Post partum procedures:none  Augmentation: none  Complications: None  Hospital course:  Onset of Labor With Vaginal Delivery     36 y.o. yo G3P3003 at [redacted]w[redacted]d admitted in second stage labor and precipitously delivered in hands and knees position on 07/09/2016. Membrane Rupture Time/Date: 1:40 PM ,07/09/2016   Intrapartum Procedures: Episiotomy: None [1]                                         Lacerations:  None [1]  Patient had a delivery of a Viable infant. Baby was vigorously crying with good tone. Cord clamped x2 and cut. At about 3.5 minutes, baby became limp with low HR, code apgar was called and baby taken to warmer to be evaluated by NNT team.  07/09/2016  Information for the patient's newborn:  Gwenivere, Hiraldo Girl Carol-Kay [[id number]]  Delivery Method: Vaginal, Spontaneous Delivery (Filed from Delivery Summary)    Pateint had an uncomplicated postpartum course.  She is ambulating, tolerating a regular diet, passing flatus, and urinating well. Patient is discharged home in stable condition on 07/11/16.   Physical exam  Vitals:   07/09/16 2105 07/10/16 0505 07/10/16 1718 07/11/16 0531  BP: 115/73 104/64 113/76 (!) 102/58  Pulse: (!) 56 60 63 68  Resp: 18 18 16 18   Temp: 98.1 F (36.7 C) 97.7 F (36.5 C)  98.4 F (36.9 C) 98.1 F (36.7 C)  TempSrc: Oral Oral Oral Oral  SpO2:  99% 100%   Weight:      Height:       General: alert, cooperative and no distress Lochia: appropriate Uterine Fundus: firm Incision: N/A DVT Evaluation: No evidence of DVT seen on physical exam. Labs: Lab Results  Component Value Date   WBC 9.7 07/09/2016   HGB 11.6 (L) 07/09/2016   HCT 35.3 (L) 07/09/2016   MCV 80.2 07/09/2016   PLT 183 07/09/2016   CMP Latest Ref Rng & Units 11/28/2015  Glucose 65 - 99 mg/dL 130(H)  BUN 6 - 20 mg/dL 11  Creatinine 0.44 - 1.00 mg/dL 0.70  Sodium 135 - 145 mmol/L 135  Potassium 3.5 - 5.1 mmol/L 4.2  Chloride 101 - 111 mmol/L 105  CO2 22 - 32 mmol/L 25  Calcium 8.9 - 10.3 mg/dL 9.2  Total Protein 6.5 - 8.1 g/dL  7.2  Total Bilirubin 0.3 - 1.2 mg/dL 0.3  Alkaline Phos 38 - 126 U/L 49  AST 15 - 41 U/L 17  ALT 14 - 54 U/L 10(L)    Discharge instruction: per After Visit Summary and "Baby and Me Booklet".  After visit meds:  Allergies as of 07/11/2016   No Known Allergies     Medication List    STOP taking these medications   omeprazole 20 MG capsule Commonly known as:  PRILOSEC     TAKE these medications   calcium carbonate 500 MG chewable tablet Commonly known as:  TUMS - dosed in mg elemental calcium Chew 2 tablets by mouth daily as needed for indigestion or heartburn.   ibuprofen 600 MG tablet Commonly known as:  ADVIL,MOTRIN Take 1 tablet (600 mg total) by mouth every 6 (six) hours.   prenatal multivitamin Tabs tablet Take 1 tablet by mouth every morning.   Vitamin D 2000 units Caps Take 1 capsule by mouth daily.       Diet: routine diet  Activity: Advance as tolerated. Pelvic rest for 6 weeks.   Outpatient follow up:6 weeks Follow up Appt: Future Appointments Date Time Provider [place]  07/13/2016 11:30 AM Donnamae Jude, MD [facility]  07/20/2016 11:30 AM Osborne Oman, MD [facility]   Follow up Visit:No  Follow-up on file.  Postpartum contraception: Nexplanon  Newborn Data: Live born female  Birth Weight: 6 lb 3.1 oz (2810 g) APGAR: 7, 4  Baby Feeding: Breast Disposition:home with mother   07/11/2016 Mercy Riding, MD  OB FELLOW DISCHARGE ATTESTATION  I confirm that I have verified the information documented in the resident's note and that I have also personally reperformed the physical exam and all medical decision making activities.  Jacquiline Doe, MD 7:19 AM

## 2016-07-11 MED ORDER — IBUPROFEN 600 MG PO TABS: 600.0000 mg | ORAL_TABLET | Freq: Four times a day (QID) | ORAL | 0 refills | Status: DC

## 2016-07-11 NOTE — Lactation Note (Signed)
This note was copied from a baby's chart. Lactation Consultation Note  Mother requesting information on acquiring a NS. Discussed risks and benefits of and she declined. She will follow-up as an OP if soreness does not resolve in a couple of days.  Patient Name: Girl Bridgette Wolden [id number] Date: 07/11/2016 Reason for consult: Follow-up assessment   Maternal Data    Feeding Feeding Type: Breast Fed Length of feed: 20 min  LATCH Score/Interventions Latch: Repeated attempts needed to sustain latch, nipple held in mouth throughout feeding, stimulation needed to elicit sucking reflex. Intervention(s): Skin to skin;Teach feeding cues Intervention(s): Assist with latch;Adjust position;Breast massage;Breast compression  Audible Swallowing: A few with stimulation  Type of Nipple: Everted at rest and after stimulation  Comfort (Breast/Nipple): Filling, red/small blisters or bruises, mild/mod discomfort  Problem noted: Cracked, bleeding, blisters, bruises;Severe discomfort (nipples with latching) Interventions  (Cracked/bleeding/bruising/blister): Expressed breast milk to nipple (comfort gels) Interventions (Severe discomfort): Double electric pum (has at home. )  Hold (Positioning): Assistance needed to correctly position infant at breast and maintain latch. Intervention(s): Support Pillows;Position options;Skin to skin;Breastfeeding basics reviewed  LATCH Score: 6  Lactation Tools Discussed/Used     Consult Status Consult Status: Complete    Van Clines 07/11/2016, 11:31 AM

## 2016-07-11 NOTE — Discharge Instructions (Signed)

## 2016-07-11 NOTE — Lactation Note (Signed)
This note was copied from a baby's chart. Lactation Consultation Note  Mother is continuing to have sore nipples.  Encouraged breast compression to aid in transfer. Currently using comfort gels to heal cracking.  Manual pump given to mother with use of and cleaning instructions. Understanding verbalized. Patient Name: Brittany Vasquez [id number] Date: 07/11/2016 Reason for consult: Follow-up assessment   Maternal Data    Feeding Feeding Type: Breast Fed Length of feed: 20 min  LATCH Score/Interventions Latch: Repeated attempts needed to sustain latch, nipple held in mouth throughout feeding, stimulation needed to elicit sucking reflex. Intervention(s): Skin to skin;Teach feeding cues Intervention(s): Assist with latch;Adjust position;Breast massage;Breast compression  Audible Swallowing: A few with stimulation  Type of Nipple: Everted at rest and after stimulation  Comfort (Breast/Nipple): Filling, red/small blisters or bruises, mild/mod discomfort  Problem noted: Cracked, bleeding, blisters, bruises;Severe discomfort (nipples with latching) Interventions  (Cracked/bleeding/bruising/blister): Expressed breast milk to nipple (comfort gels) Interventions (Severe discomfort): Double electric pum (has at home. )  Hold (Positioning): Assistance needed to correctly position infant at breast and maintain latch. Intervention(s): Support Pillows;Position options;Skin to skin;Breastfeeding basics reviewed  LATCH Score: 6  Lactation Tools Discussed/Used     Consult Status Consult Status: Complete    Van Clines 07/11/2016, 10:17 AM

## 2016-07-12 ENCOUNTER — Encounter (HOSPITAL_COMMUNITY): Payer: Self-pay | Admitting: *Deleted

## 2016-07-13 ENCOUNTER — Encounter: Payer: Non-veteran care | Admitting: Family Medicine

## 2016-07-20 ENCOUNTER — Encounter: Payer: Non-veteran care | Admitting: Obstetrics & Gynecology

## 2016-07-30 ENCOUNTER — Telehealth: Payer: Self-pay | Admitting: *Deleted

## 2016-07-30 DIAGNOSIS — B370 Candidal stomatitis: Secondary | ICD-10-CM

## 2016-07-30 MED ORDER — NYSTATIN 100000 UNIT/GM EX CREA: 1.0000 "application " | TOPICAL_CREAM | Freq: Two times a day (BID) | CUTANEOUS | 1 refills | Status: DC

## 2016-07-30 NOTE — Telephone Encounter (Signed)
Pt called in wanted medication for breast yeast - baby is breastfeeding and has thrush. Sent Nystatin per Dr Ilda Basset.

## 2016-08-19 ENCOUNTER — Ambulatory Visit (INDEPENDENT_AMBULATORY_CARE_PROVIDER_SITE_OTHER): Payer: Medicare Other | Admitting: Obstetrics & Gynecology

## 2016-08-19 ENCOUNTER — Encounter: Payer: Self-pay | Admitting: Obstetrics & Gynecology

## 2016-08-19 DIAGNOSIS — Z3046 Encounter for surveillance of implantable subdermal contraceptive: Secondary | ICD-10-CM

## 2016-08-19 DIAGNOSIS — Z30017 Encounter for initial prescription of implantable subdermal contraceptive: Secondary | ICD-10-CM

## 2016-08-19 MED ORDER — ETONOGESTREL 68 MG ~~LOC~~ IMPL
68.0000 mg | DRUG_IMPLANT | Freq: Once | SUBCUTANEOUS | Status: AC
  Administered 2016-08-19: 68 mg via SUBCUTANEOUS

## 2016-08-19 NOTE — Progress Notes (Signed)
  Post Partum Exam  Brittany Vasquez is a 36 y.o. G53P3003 female who presents for a postpartum visit. She is 6 weeks postpartum following a spontaneous vaginal delivery. I have fully reviewed the prenatal and intrapartum course. The delivery was at 107w6d gestational weeks.  Anesthesia: none. Postpartum course has been unremarkable but would like a breast check. Baby's course has been unremarkable. Baby is feeding by breast. Bleeding thin lochia. Bowel function is normal. Bladder function is normal. Patient is not sexually active. Contraception method is Nexplanon. Postpartum depression screening: Negative  The following portions of the patient's history were reviewed and updated as appropriate: allergies, current medications, past family history, past medical history, past social history, past surgical history and problem list.  Review of Systems Pertinent items are noted in HPI.    Objective:  unknown if currently breastfeeding.  General:  alert   Breasts:  inspection negative, no nipple discharge or bleeding, no masses or nodularity palpable  Lungs: clear to auscultation bilaterally  Heart:  regular rate and rhythm, S1, S2 normal, no murmur, click, rub or gallop  Abdomen: soft, non-tender; bowel sounds normal; no masses,  no organomegaly   Vulva:  not evaluated  Vagina: not evaluated  Cervix:  not examined  Corpus: not examined  Adnexa:  normal adnexa  Rectal Exam: Not performed.   UPT was negative. Consent was signed. Time out procedure was done. Her left arm was prepped with betadine and infiltrated with 3 cc of 1% lidocaine. After adequate anesthesia was assured, the Nexplanon device was placed according to standard of care. Her arm was hemostatic and was bandaged. She tolerated the procedure well.   Assessment:    Normal postpartum exam. Pap smear not done at today's visit.   Plan:   1. Contraception: Nexplanon 2. She will get her pap next week at the [facility] hospital 3.  Follow up in: 3 years for new Nexplanon/prn sooner or as needed.

## 2016-08-20 ENCOUNTER — Ambulatory Visit: Payer: Medicare Other | Admitting: Obstetrics and Gynecology

## 2017-02-04 ENCOUNTER — Encounter: Payer: Self-pay | Admitting: Podiatry

## 2017-02-04 ENCOUNTER — Ambulatory Visit (INDEPENDENT_AMBULATORY_CARE_PROVIDER_SITE_OTHER): Payer: Non-veteran care | Admitting: Podiatry

## 2017-02-04 VITALS — BP 113/72 | HR 79 | Resp 16

## 2017-02-04 DIAGNOSIS — M722 Plantar fascial fibromatosis: Secondary | ICD-10-CM

## 2017-02-04 DIAGNOSIS — L84 Corns and callosities: Secondary | ICD-10-CM

## 2017-02-04 NOTE — Progress Notes (Addendum)
  Subjective:  Patient ID: Brittany Vasquez, female    DOB: [birth year],  MRN: [id number]  Chief Complaint  Patient presents with  . Foot Pain    Plantar heel left - callused area x 2 months, very tender, tried medicated pad, also callused area plantar forefoot left   . Foot Pain    History of plantar fasciitis and has AM pain occasionally   36 y.o. female presents with the above complaint.  Reports left foot pain worst in the a.m.  Has a history of plantar fasciitis and believes this is a recurrence.  Also reports of callused area to the left ball of foot and heel times 2 months.  Has tried medicated salicylic acid pads without relief  Past Medical History:  Diagnosis Date  . Fibromyalgia   . Mass of perirectal soft tissue - prob seb cyst 05/22/2012  . Ulcerative colitis [facility])    Past Surgical History:  Procedure Laterality Date  . CESAREAN SECTION  01/27/2012   Procedure: CESAREAN SECTION;  Surgeon: Thurnell Lose, MD;  Location: [place] [facility];  Service: Obstetrics;  Laterality: N/A;  Primary Cesarean Section with birth of baby boy @ 65    Current Outpatient Prescriptions:  .  Cholecalciferol (VITAMIN D) 2000 units CAPS, Take 1 capsule by mouth daily., Disp: , Rfl:  .  Prenatal Vit-Fe Fumarate-FA (PRENATAL MULTIVITAMIN) TABS, Take 1 tablet by mouth every morning., Disp: , Rfl:   No Known Allergies Review of Systems  All other systems reviewed and are negative.  Objective:   Vitals:   02/04/17 0822  BP: 113/72  Pulse: 79  Resp: 16   General AA&O x3. Normal mood and affect.  Vascular Dorsalis pedis and posterior tibial pulses  present 2+ bilaterally  Capillary refill normal to all digits. Pedal hair growth normal.  Neurologic Epicritic sensation grossly present bilaterally.  Dermatologic No open lesions. Interspaces clear of maceration. Nails well groomed and normal in appearance.  Orthopedic: MMT 5/5 in dorsiflexion, plantarflexion, inversion, and eversion  bilaterally. Tender to palpation at the calcaneal tuber left. No pain with calcaneal squeeze left. Ankle ROM diminished range of motion left. Silfverskiold Test: positive left.   Assessment & Plan:  Patient was evaluated and treated and all questions answered.  Plantar Fasciitis, left - XR reviewed as above.  - Educated on icing and stretching. Instructions given.   Callus of Foot -Educated on self-care -Recommend revitaderm cream.  Return in about 3 weeks (around 02/25/2017) for Plantar fasciitis.

## 2017-02-04 NOTE — Patient Instructions (Signed)

## 2017-02-07 ENCOUNTER — Telehealth: Payer: Self-pay | Admitting: Podiatry

## 2017-02-07 NOTE — Telephone Encounter (Signed)
I saw Dr. March Rummage last week and he told me about a lotion that your office sell that would help break down the calluses on my feet. I was wanting to know the name of it, how much it costs, and if I could come by at anytime to pick it up. You can reach me at [phone]. Thank you.

## 2017-02-07 NOTE — Telephone Encounter (Signed)
I informed pt Revitaderm40 was used to soften callouses and would soften nails to use gloves to apply at night, $22.00 for 40oz would use a small amount daily. I put a jar with her name at front reception.

## 2017-02-09 ENCOUNTER — Ambulatory Visit: Payer: Medicare Other | Admitting: Podiatry

## 2017-03-04 ENCOUNTER — Encounter: Payer: Self-pay | Admitting: Podiatry

## 2017-03-04 ENCOUNTER — Ambulatory Visit (INDEPENDENT_AMBULATORY_CARE_PROVIDER_SITE_OTHER): Payer: Non-veteran care | Admitting: Podiatry

## 2017-03-04 DIAGNOSIS — M722 Plantar fascial fibromatosis: Secondary | ICD-10-CM

## 2017-03-04 DIAGNOSIS — Q828 Other specified congenital malformations of skin: Secondary | ICD-10-CM | POA: Diagnosis not present

## 2017-03-04 NOTE — Progress Notes (Signed)
  Subjective:  Patient ID: Brittany Vasquez, female    DOB: 1980-12-23,  MRN: [id number]  Chief Complaint  Patient presents with  . Plantar Fasciitis    bilateral heel pain continues  . Callouses    Lt foot painful callus   36 y.o. female returns for the above complaint.  States that the heel pain continues.  Has been attempting to do stretches.  States that she still has a painful callus to her left heel that came back shortly after her last visit  Objective:  There were no vitals filed for this visit. General AA&O x3. Normal mood and affect.  Vascular Pedal pulses palpable.  Neurologic Epicritic sensation grossly intact.  Dermatologic No open lesions. Skin normal texture and turgor. Punctate hyperkeratotic lesion left heel  Orthopedic: Pain to palpation left medial calcaneal tuber bilat    Assessment & Plan:  Patient was evaluated and treated and all questions answered.  Plantar fasciitis bilateral -Educated on continued stretching and icing -No injection today.  Porokeratosis left heel -Lesion palliatively debrided today -Discussed that should she continue to have pain would consider excisional punch biopsy of the lesion  Procedure: Paring of Lesion Rationale: painful hyperkeratotic lesion Type of Debridement: manual, sharp debridement. Instrumentation: 15 blade Number of Lesions: 1  Return in about 4 weeks (around 04/01/2017) for porokeratosis f/u.

## 2017-03-10 LAB — CYTOLOGY - PAP: Pap: NEGATIVE

## 2017-03-16 ENCOUNTER — Other Ambulatory Visit: Payer: Non-veteran care | Admitting: Orthotics

## 2017-03-18 ENCOUNTER — Ambulatory Visit: Payer: Non-veteran care | Admitting: Orthotics

## 2017-04-01 ENCOUNTER — Ambulatory Visit (INDEPENDENT_AMBULATORY_CARE_PROVIDER_SITE_OTHER): Payer: Non-veteran care | Admitting: Podiatry

## 2017-04-01 DIAGNOSIS — Q828 Other specified congenital malformations of skin: Secondary | ICD-10-CM

## 2017-04-02 NOTE — Progress Notes (Signed)
  Subjective:  Patient ID: Brittany Vasquez, female    DOB: 08/28/1980,  MRN: [id number]  Chief Complaint  Patient presents with  . Callouses      4 wk  porokeratosis f/u/ sweat gland out **va auth [id number] visit 3 of 5 exp 4.2.19**   36 y.o. female returns for the above complaint.  States that the lesion still continues to hurt her.  Objective:  There were no vitals filed for this visit. General AA&O x3. Normal mood and affect.  Vascular Pedal pulses palpable.  Neurologic Epicritic sensation grossly intact.  Dermatologic  punctate hyperkeratotic lesion left heel with pain  Orthopedic: Pain to palpation left heel   Assessment & Plan:  Patient was evaluated and treated and all questions answered.  Porokeratosis -Offered punch biopsy lesion however patient denied. -Lesion debrided and salinocaine ointment applied in order to destroy the lesion -Padding dispensed  Return in about 6 weeks (around 05/13/2017).

## 2017-04-05 DIAGNOSIS — M069 Rheumatoid arthritis, unspecified: Secondary | ICD-10-CM | POA: Insufficient documentation

## 2017-04-05 NOTE — L&D Delivery Note (Signed)
Delivery Note At 2:59 AM a viable female was delivered via VBAC, Spontaneous (Presentation: Vertex; ROA ).  APGAR: 8, 9; weight pending.   Placenta status: spontaneous, intact.  Cord: 3vc with the following complications: none.  Cord pH: n/a  Anesthesia:  epidural Episiotomy: None Lacerations: None Est. Blood Loss (mL): 300  Mom to postpartum.  Baby to Couplet care / Skin to Skin.  Truett Mainland 02/15/2018, 3:11 AM

## 2017-04-08 ENCOUNTER — Encounter: Payer: Non-veteran care | Admitting: Orthotics

## 2017-04-22 ENCOUNTER — Telehealth: Payer: Self-pay | Admitting: Podiatry

## 2017-04-22 NOTE — Telephone Encounter (Signed)
Called pt to get her scheduled to pick up her orthotics and she said she told the girls at the front to cancel the order at her last appt because her insurance will not pay for them. 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

## 2017-05-05 NOTE — Progress Notes (Unsigned)

## 2017-05-27 ENCOUNTER — Ambulatory Visit (INDEPENDENT_AMBULATORY_CARE_PROVIDER_SITE_OTHER): Payer: Non-veteran care | Admitting: Podiatry

## 2017-05-27 DIAGNOSIS — Q828 Other specified congenital malformations of skin: Secondary | ICD-10-CM | POA: Diagnosis not present

## 2017-05-27 DIAGNOSIS — R269 Unspecified abnormalities of gait and mobility: Secondary | ICD-10-CM | POA: Diagnosis not present

## 2017-05-27 DIAGNOSIS — M25372 Other instability, left ankle: Secondary | ICD-10-CM | POA: Diagnosis not present

## 2017-05-27 NOTE — Progress Notes (Signed)
  Subjective:  Patient ID: Brittany Vasquez, female    DOB: 23-Jul-1980,  MRN: [id number]  Chief Complaint  Patient presents with  . Callouses    follow up porokeratosis   37 y.o. female returns for the above complaint. States that he will feels much better.  States that she is no longer limping.  States that she did smooth down the area a little bit herself.  States that she is now able to be more active.  New complaint of bilateral ankle pain left greater than right.  States that her ankles feel unstable  Objective:  There were no vitals filed for this visit. General AA&O x3. Normal mood and affect.  Vascular Pedal pulses palpable.  Neurologic Epicritic sensation grossly intact.  Dermatologic Punctate hyperkeratotic lesion left heel skin normal texture and turgor.  Orthopedic: Pain to palpation about the hyperkeratotic lesion left heel.  Pain to palpation about the bilateral ATFL left greater than right   Assessment & Plan:  Patient was evaluated and treated and all questions answered.  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  Porokeratosis -Debrided and destroyed again as below.  Procedure: Destruction of Lesion Location: L Heel Anesthesia: none Instrumentation: 15 blade. Technique: Debridement of lesion. Aperture pad applied around lesion. Small amount of salinocaine applied to the base of the lesion. Dressing: Dry, sterile, compression dressing. Disposition: Patient tolerated procedure well. Advised to leave dressing on for 6-8 hours. Thereafter patient to wash the area with soap and water and applied band-aid. Off-loading pads dispensed.    Return in about 4 weeks (around 06/24/2017) for Porokeratosis, Ankle Insufficiency.

## 2017-06-08 DIAGNOSIS — M797 Fibromyalgia: Secondary | ICD-10-CM | POA: Diagnosis not present

## 2017-06-08 DIAGNOSIS — M25512 Pain in left shoulder: Secondary | ICD-10-CM | POA: Diagnosis not present

## 2017-06-08 DIAGNOSIS — M25569 Pain in unspecified knee: Secondary | ICD-10-CM | POA: Diagnosis not present

## 2017-06-24 ENCOUNTER — Ambulatory Visit: Payer: Non-veteran care | Admitting: Podiatry

## 2017-07-01 ENCOUNTER — Ambulatory Visit (INDEPENDENT_AMBULATORY_CARE_PROVIDER_SITE_OTHER): Payer: Non-veteran care | Admitting: Podiatry

## 2017-07-01 ENCOUNTER — Encounter: Payer: Self-pay | Admitting: Podiatry

## 2017-07-01 DIAGNOSIS — Q828 Other specified congenital malformations of skin: Secondary | ICD-10-CM | POA: Diagnosis not present

## 2017-07-01 DIAGNOSIS — M25372 Other instability, left ankle: Secondary | ICD-10-CM

## 2017-07-01 NOTE — Progress Notes (Signed)
  Subjective:  Patient ID: Brittany Vasquez, female    DOB: Jan 05, 1981,  MRN: [id number]  Chief Complaint  Patient presents with  . Plantar Warts    lesion on left heel still painful   37 y.o. female returns for the above complaint.  States that the lesion came back and is now causing her pain.  Does not want to have the lesion excised.  States that her ankles are feeling much better not causing her any pain.  Objective:  There were no vitals filed for this visit. General AA&O x3. Normal mood and affect.  Vascular Pedal pulses palpable.  Neurologic Epicritic sensation grossly intact.  Dermatologic Punctate hyperkeratotic lesion left heel skin normal texture and turgor.  Orthopedic: Pain to palpation about the hyperkeratotic lesion left heel.  No pain to palpation about the ATFL bilateral   Assessment & Plan:  Patient was evaluated and treated and all questions answered.  Porokeratosis -Debrided and destroyed again as below.  Procedure: Destruction of Lesion Location: L heel Anesthesia: none Instrumentation: 15 blade. Technique: Debridement of lesion; aperture pad applied around lesion. Small amount of salinocaine applied to the base of the lesion. Dressing: Dry, sterile, compression dressing. Disposition: Patient tolerated procedure well. Advised to leave dressing on for 6-8 hours. Thereafter patient to wash the area with soap and water and applied band-aid. Off-loading pads dispensed. Patient to return in 2 weeks for follow-up.   Return in about 6 weeks (around 08/12/2017) for skin lesion f/u.

## 2017-07-02 DIAGNOSIS — M25561 Pain in right knee: Secondary | ICD-10-CM | POA: Diagnosis not present

## 2017-07-02 DIAGNOSIS — M25562 Pain in left knee: Secondary | ICD-10-CM | POA: Diagnosis not present

## 2017-07-19 ENCOUNTER — Encounter: Payer: Self-pay | Admitting: *Deleted

## 2017-07-19 ENCOUNTER — Encounter: Payer: Self-pay | Admitting: Obstetrics & Gynecology

## 2017-07-19 ENCOUNTER — Ambulatory Visit (INDEPENDENT_AMBULATORY_CARE_PROVIDER_SITE_OTHER): Payer: Non-veteran care | Admitting: Obstetrics & Gynecology

## 2017-07-19 DIAGNOSIS — O09521 Supervision of elderly multigravida, first trimester: Secondary | ICD-10-CM

## 2017-07-19 DIAGNOSIS — O0990 Supervision of high risk pregnancy, unspecified, unspecified trimester: Secondary | ICD-10-CM

## 2017-07-19 DIAGNOSIS — O0991 Supervision of high risk pregnancy, unspecified, first trimester: Secondary | ICD-10-CM

## 2017-07-19 DIAGNOSIS — O99210 Obesity complicating pregnancy, unspecified trimester: Secondary | ICD-10-CM

## 2017-07-19 DIAGNOSIS — O09529 Supervision of elderly multigravida, unspecified trimester: Secondary | ICD-10-CM | POA: Insufficient documentation

## 2017-07-19 DIAGNOSIS — Z3687 Encounter for antenatal screening for uncertain dates: Secondary | ICD-10-CM | POA: Diagnosis not present

## 2017-07-19 DIAGNOSIS — E669 Obesity, unspecified: Secondary | ICD-10-CM

## 2017-07-19 DIAGNOSIS — Z3490 Encounter for supervision of normal pregnancy, unspecified, unspecified trimester: Secondary | ICD-10-CM | POA: Insufficient documentation

## 2017-07-19 DIAGNOSIS — Z113 Encounter for screening for infections with a predominantly sexual mode of transmission: Secondary | ICD-10-CM

## 2017-07-19 DIAGNOSIS — O99211 Obesity complicating pregnancy, first trimester: Secondary | ICD-10-CM

## 2017-07-19 MED ORDER — PRENATAL VITAMINS 0.8 MG PO TABS: 1.0000 | ORAL_TABLET | Freq: Every day | ORAL | 12 refills | Status: DC

## 2017-07-19 MED ORDER — DOXYLAMINE-PYRIDOXINE ER 20-20 MG PO TBCR: 1.0000 | EXTENDED_RELEASE_TABLET | Freq: Every day | ORAL | 6 refills | Status: DC

## 2017-07-19 MED ORDER — CALCIUM CITRATE-VITAMIN D 250-100 MG-UNIT PO TABS: 1.0000 | ORAL_TABLET | Freq: Two times a day (BID) | ORAL | 6 refills | Status: DC

## 2017-07-19 NOTE — Progress Notes (Signed)
bDATING AND VIABILITY SONOGRAM   Brittany Vasquez is a 37 y.o. year old G65P3003 with LMP Patient's last menstrual period was 05/16/2017. which would correlate to  [redacted]w[redacted]d weeks gestation.  She has regular menstrual cycles.   She is here today for a confirmatory initial sonogram.    GESTATION: [place]     FETAL ACTIVITY:          Heart rate: 173          The fetus is active.   GESTATIONAL AGE AND  BIOMETRICS:  Gestational criteria: Estimated Date of Delivery: 02/20/18 by LMP now at [redacted]w[redacted]d  Previous Scans:0  CROWN RUMP LENGTH 2.58cm 9.2wks                                                   AVERAGE EGA(BY THIS SCAN):  9.2 weeks  WORKING EDD( LMP ):  02/20/18     TECHNICIAN COMMENTS:  SLIUP measuring 9.2wks by [place] with FHR 173bpm  Mandy Hutchinson 07/19/2017 11:00 AM

## 2017-07-19 NOTE — Progress Notes (Signed)
  Subjective:    Brittany Vasquez is being seen today for her first obstetrical visit.  This is a planned pregnancy. She is at 101w1d gestation. Her obstetrical history is significant for advanced maternal age, obesity and previous cesarean. Relationship with FOB: spouse, living together. Patient does intend to breast feed. Pregnancy history fully reviewed.  Patient reports nausea.  Review of Systems:   Review of Systems  Objective:     BP 113/79   Pulse (!) 103   Wt 187 lb (84.8 kg)   LMP 05/16/2017   BMI 29.29 kg/m  Physical Exam  Exam    Assessment:    Pregnancy: [id number] Patient Active Problem List   Diagnosis Date Noted  . Supervision of high risk pregnancy, antepartum 07/19/2017  . AMA (advanced maternal age) multigravida 35+ 07/19/2017  . Obesity in pregnancy, antepartum 07/19/2017  . VBAC (vaginal birth after Cesarean) 07/10/2016  . Normal labor 07/09/2016       Plan:     Initial labs drawn. Prenatal vitamins. Problem list reviewed and updated. AFP3 discussed: declined. NIPS requested and will be done at next visit. Role of ultrasound in pregnancy discussed; fetal survey: requested. Follow up in 3 weeks. Start Baby Scripts HBA1C and TSH today with NOB labs  Emily Filbert 07/19/2017

## 2017-07-20 ENCOUNTER — Encounter: Payer: Self-pay | Admitting: *Deleted

## 2017-07-20 ENCOUNTER — Telehealth: Payer: Self-pay | Admitting: *Deleted

## 2017-07-20 ENCOUNTER — Encounter: Payer: Self-pay | Admitting: Obstetrics & Gynecology

## 2017-07-20 DIAGNOSIS — R7989 Other specified abnormal findings of blood chemistry: Secondary | ICD-10-CM | POA: Insufficient documentation

## 2017-07-20 LAB — OBSTETRIC PANEL, INCLUDING HIV
Antibody Screen: NEGATIVE
Basophils Absolute: 0 10*3/uL (ref 0.0–0.2)
Basos: 0 %
EOS (ABSOLUTE): 0.1 10*3/uL (ref 0.0–0.4)
EOS: 1 %
HEMATOCRIT: 38.7 % (ref 34.0–46.6)
HEMOGLOBIN: 13.1 g/dL (ref 11.1–15.9)
HIV SCREEN 4TH GENERATION: NONREACTIVE
Hepatitis B Surface Ag: NEGATIVE
Immature Grans (Abs): 0 10*3/uL (ref 0.0–0.1)
Immature Granulocytes: 0 %
LYMPHS ABS: 1.2 10*3/uL (ref 0.7–3.1)
Lymphs: 19 %
MCH: 26.8 pg (ref 26.6–33.0)
MCHC: 33.9 g/dL (ref 31.5–35.7)
MCV: 79 fL (ref 79–97)
MONOS ABS: 0.5 10*3/uL (ref 0.1–0.9)
Monocytes: 8 %
Neutrophils Absolute: 4.3 10*3/uL (ref 1.4–7.0)
Neutrophils: 72 %
Platelets: 258 10*3/uL (ref 150–379)
RBC: 4.88 x10E6/uL (ref 3.77–5.28)
RDW: 14.1 % (ref 12.3–15.4)
RH TYPE: POSITIVE
RPR Ser Ql: NONREACTIVE
Rubella Antibodies, IGG: 1.5 index (ref >0.99)
WBC: 6.1 10*3/uL (ref 3.4–10.8)

## 2017-07-20 LAB — TSH: TSH: 0.266 u[IU]/mL — ABNORMAL LOW (ref 0.450–4.500)

## 2017-07-20 LAB — URINE CYTOLOGY ANCILLARY ONLY
Chlamydia: NEGATIVE
Neisseria Gonorrhea: NEGATIVE

## 2017-07-20 LAB — HEMOGLOBIN A1C
Est. average glucose Bld gHb Est-mCnc: 120 mg/dL
HEMOGLOBIN A1C: 5.8 % — AB (ref 4.8–5.6)

## 2017-07-20 NOTE — Telephone Encounter (Signed)
-----   Message from Emily Filbert, MD sent at 07/20/2017  1:19 PM EDT ----- She will need free T4 and free T3

## 2017-07-21 LAB — SPECIMEN STATUS REPORT

## 2017-07-21 LAB — URINE CULTURE, OB REFLEX

## 2017-07-21 LAB — T4, FREE: Free T4: 1.4 ng/dL (ref 0.82–1.77)

## 2017-07-21 LAB — T3, FREE: T3, Free: 3.5 pg/mL (ref 2.0–4.4)

## 2017-07-21 LAB — CULTURE, OB URINE

## 2017-07-26 DIAGNOSIS — R7989 Other specified abnormal findings of blood chemistry: Secondary | ICD-10-CM

## 2017-07-26 DIAGNOSIS — O0990 Supervision of high risk pregnancy, unspecified, unspecified trimester: Secondary | ICD-10-CM

## 2017-07-26 DIAGNOSIS — O99210 Obesity complicating pregnancy, unspecified trimester: Secondary | ICD-10-CM

## 2017-07-26 DIAGNOSIS — O09529 Supervision of elderly multigravida, unspecified trimester: Secondary | ICD-10-CM

## 2017-07-27 LAB — HEMOGLOBINOPATHY EVALUATION
HEMOGLOBIN A2 QUANTITATION: 2 % (ref 1.8–3.2)
HEMOGLOBIN F QUANTITATION: 0 % (ref 0.0–2.0)
HGB C: 0 %
HGB S: 0 %
HGB VARIANT: 0 %
Hgb A: 98 % (ref 96.4–98.8)

## 2017-07-27 LAB — CYSTIC FIBROSIS MUTATION 97: Interpretation: NOT DETECTED

## 2017-08-09 ENCOUNTER — Ambulatory Visit (INDEPENDENT_AMBULATORY_CARE_PROVIDER_SITE_OTHER): Payer: Non-veteran care | Admitting: Family Medicine

## 2017-08-09 VITALS — BP 116/75 | HR 92 | Wt 191.8 lb

## 2017-08-09 DIAGNOSIS — O34219 Maternal care for unspecified type scar from previous cesarean delivery: Secondary | ICD-10-CM

## 2017-08-09 DIAGNOSIS — O0990 Supervision of high risk pregnancy, unspecified, unspecified trimester: Secondary | ICD-10-CM

## 2017-08-09 DIAGNOSIS — R7989 Other specified abnormal findings of blood chemistry: Secondary | ICD-10-CM

## 2017-08-09 DIAGNOSIS — O09522 Supervision of elderly multigravida, second trimester: Secondary | ICD-10-CM

## 2017-08-09 NOTE — Patient Instructions (Signed)

## 2017-08-09 NOTE — Progress Notes (Signed)
   PRENATAL VISIT NOTE  Subjective:  Brittany Vasquez is a 37 y.o. G4P3003 at [redacted]w[redacted]d being seen today for ongoing prenatal care.  She is currently monitored for the following issues for this low-risk pregnancy and has Previous cesarean delivery affecting pregnancy, antepartum; Supervision of high risk pregnancy, antepartum; AMA (advanced maternal age) multigravida 35+; Obesity in pregnancy, antepartum; and Abnormal TSH on their problem list.  Patient reports no complaints.  Contractions: Irritability. Vag. Bleeding: None.  Movement: Absent. Denies leaking of fluid.   The following portions of the patient's history were reviewed and updated as appropriate: allergies, current medications, past family history, past medical history, past social history, past surgical history and problem list. Problem list updated.  Objective:   Vitals:   08/09/17 1108  BP: 116/75  Pulse: 92  Weight: 191 lb 12.8 oz (87 kg)    Fetal Status:     Movement: Absent   FHR 166  General:  Alert, oriented and cooperative. Patient is in no acute distress.  Skin: Skin is warm and dry. No rash noted.   Cardiovascular: Normal heart rate noted  Respiratory: Normal respiratory effort, no problems with respiration noted  Abdomen: Soft, gravid, appropriate for gestational age.  Pain/Pressure: Absent     Pelvic: Cervical exam deferred        Extremities: Normal range of motion.  Edema: None  Mental Status: Normal mood and affect. Normal behavior. Normal judgment and thought content.   Assessment and Plan:  Pregnancy: G4P3003 at [redacted]w[redacted]d  1. Multigravida of advanced maternal age in second trimester NIPT today - Genetic Screening - US MFM OB DETAIL +14 WK; Future  2. Supervision of high risk pregnancy, antepartum Continue routine prenatal care. Using BabyScripts  3. Previous cesarean delivery affecting pregnancy, antepartum Desires VBAC again  General obstetric precautions including but not limited to vaginal  bleeding, contractions, leaking of fluid and fetal movement were reviewed in detail with the patient. Please refer to After Visit Summary for other counseling recommendations.  Return in about 8 weeks (around 10/04/2017).  Future Appointments  Date Time Provider [place]  08/12/2017 10:15 AM Evelina Bucy, DPM [facility]  08/31/2017 11:15 AM [facility] [place] 4 [facility]  09/30/2017 11:00 AM Anyanwu, Sallyanne Havers, MD [facility]    Donnamae Jude, MD

## 2017-08-12 ENCOUNTER — Ambulatory Visit: Payer: Non-veteran care | Admitting: Podiatry

## 2017-08-31 ENCOUNTER — Ambulatory Visit (HOSPITAL_COMMUNITY)
Admission: RE | Admit: 2017-08-31 | Discharge: 2017-08-31 | Disposition: A | Discharge status: Home / Self Care | Payer: Non-veteran care | Source: Ambulatory Visit | Attending: Family Medicine | Admitting: Family Medicine

## 2017-08-31 DIAGNOSIS — O09522 Supervision of elderly multigravida, second trimester: Secondary | ICD-10-CM

## 2017-09-02 ENCOUNTER — Ambulatory Visit: Payer: Non-veteran care | Admitting: Podiatry

## 2017-09-20 ENCOUNTER — Encounter (HOSPITAL_COMMUNITY): Payer: Self-pay

## 2017-09-22 ENCOUNTER — Ambulatory Visit (INDEPENDENT_AMBULATORY_CARE_PROVIDER_SITE_OTHER): Payer: Non-veteran care | Admitting: Podiatry

## 2017-09-22 DIAGNOSIS — D2371 Other benign neoplasm of skin of right lower limb, including hip: Secondary | ICD-10-CM

## 2017-09-22 DIAGNOSIS — Q828 Other specified congenital malformations of skin: Secondary | ICD-10-CM

## 2017-09-25 NOTE — Progress Notes (Signed)
  Subjective:  Patient ID: Brittany Vasquez, female    DOB: 1980-04-09,  MRN: [id number]  No chief complaint on file.  37 y.o. female returns for the above complaint.  States that the lesion feels better after the procedure last visit.  Of note patient is now pregnant again.  Objective:  There were no vitals filed for this visit. General AA&O x3. Normal mood and affect.  Vascular Pedal pulses palpable.  Neurologic Epicritic sensation grossly intact.  Dermatologic Punctate hyperkeratotic lesion left heel skin normal texture and turgor.  Orthopedic: Pain to palpation about the hyperkeratotic lesion left heel.  No pain to palpation about the ATFL bilateral   Assessment & Plan:  Patient was evaluated and treated and all questions answered.  Porokeratosis -Debrided and re-destroyed again as below.  Procedure: Destruction of Lesion Location: L heel Anesthesia: none Instrumentation: 15 blade. Technique: Debridement of lesion; aperture pad applied around lesion. Small amount of salinocaine applied to the base of the lesion. Dressing: Dry, sterile, compression dressing. Disposition: Patient tolerated procedure well. Advised to leave dressing on for 6-8 hours. Thereafter patient to wash the area with soap and water and applied band-aid. Off-loading pads dispensed. Patient to return in 2 weeks for follow-up.   Return if symptoms worsen or fail to improve.

## 2017-09-28 ENCOUNTER — Encounter (HOSPITAL_COMMUNITY): Payer: Self-pay

## 2017-09-28 ENCOUNTER — Ambulatory Visit (HOSPITAL_COMMUNITY)
Admission: RE | Admit: 2017-09-28 | Discharge: 2017-09-28 | Disposition: A | Discharge status: Home / Self Care | Payer: Medicare Other | Source: Ambulatory Visit | Attending: Family Medicine | Admitting: Family Medicine

## 2017-09-28 DIAGNOSIS — O09522 Supervision of elderly multigravida, second trimester: Secondary | ICD-10-CM | POA: Diagnosis not present

## 2017-09-28 DIAGNOSIS — O99212 Obesity complicating pregnancy, second trimester: Secondary | ICD-10-CM | POA: Diagnosis not present

## 2017-09-28 DIAGNOSIS — O99210 Obesity complicating pregnancy, unspecified trimester: Secondary | ICD-10-CM

## 2017-09-28 DIAGNOSIS — O34219 Maternal care for unspecified type scar from previous cesarean delivery: Secondary | ICD-10-CM

## 2017-09-28 DIAGNOSIS — Z3689 Encounter for other specified antenatal screening: Secondary | ICD-10-CM | POA: Insufficient documentation

## 2017-09-28 DIAGNOSIS — Z3A19 19 weeks gestation of pregnancy: Secondary | ICD-10-CM | POA: Insufficient documentation

## 2017-09-28 DIAGNOSIS — R7989 Other specified abnormal findings of blood chemistry: Secondary | ICD-10-CM

## 2017-09-28 DIAGNOSIS — O0990 Supervision of high risk pregnancy, unspecified, unspecified trimester: Secondary | ICD-10-CM

## 2017-09-29 ENCOUNTER — Other Ambulatory Visit (HOSPITAL_COMMUNITY): Payer: Self-pay | Admitting: *Deleted

## 2017-09-29 DIAGNOSIS — O09522 Supervision of elderly multigravida, second trimester: Secondary | ICD-10-CM

## 2017-09-30 ENCOUNTER — Ambulatory Visit (INDEPENDENT_AMBULATORY_CARE_PROVIDER_SITE_OTHER): Payer: Medicare Other | Admitting: Obstetrics & Gynecology

## 2017-09-30 VITALS — BP 108/68 | HR 67 | Wt 193.6 lb

## 2017-09-30 DIAGNOSIS — O0990 Supervision of high risk pregnancy, unspecified, unspecified trimester: Secondary | ICD-10-CM

## 2017-09-30 DIAGNOSIS — O09522 Supervision of elderly multigravida, second trimester: Secondary | ICD-10-CM

## 2017-09-30 DIAGNOSIS — O34219 Maternal care for unspecified type scar from previous cesarean delivery: Secondary | ICD-10-CM

## 2017-09-30 NOTE — Progress Notes (Signed)
PRENATAL VISIT NOTE  Subjective:  Brittany Vasquez is a 37 y.o. G4P3003 at [redacted]w[redacted]d being seen today for ongoing prenatal care.  She is currently monitored for the following issues for this low-risk pregnancy and has Previous cesarean delivery affecting pregnancy, antepartum; Supervision of high risk pregnancy, antepartum; AMA (advanced maternal age) multigravida 35+; Obesity in pregnancy, antepartum; and Abnormal TSH on their problem list.  Patient reports no complaints.  Contractions: Not present. Vag. Bleeding: None.  Movement: Absent. Denies leaking of fluid.   The following portions of the patient's history were reviewed and updated as appropriate: allergies, current medications, past family history, past medical history, past social history, past surgical history and problem list. Problem list updated.  Objective:   Vitals:   09/30/17 1055  BP: 108/68  Pulse: 67  Weight: 193 lb 9.6 oz (87.8 kg)    Fetal Status: Fetal Heart Rate (bpm): 150   Movement: Absent     General:  Alert, oriented and cooperative. Patient is in no acute distress.  Skin: Skin is warm and dry. No rash noted.   Cardiovascular: Normal heart rate noted  Respiratory: Normal respiratory effort, no problems with respiration noted  Abdomen: Soft, gravid, appropriate for gestational age.  Pain/Pressure: Absent     Pelvic: Cervical exam deferred        Extremities: Normal range of motion.  Edema: None  Mental Status: Normal mood and affect. Normal behavior. Normal judgment and thought content.   [place] Mfm Ob Detail +14 Wk  Result Date: 09/28/2017 ----------------------------------------------------------------------  OBSTETRICS REPORT                      (Signed Final 09/28/2017 03:16 pm) ---------------------------------------------------------------------- Patient Info  ID #:       [id number]                          D.O.B.:  October 09, 1980 (36 yrs)  Name:       Brittany Vasquez             Visit Date: 09/28/2017  02:18 pm ---------------------------------------------------------------------- Performed By  Performed By:     Dorena Dew     Ref. Address:     [place], [place]  Attending:        Oralia Rud       Location:         [facility]                    MD  Referred By:      [facility] for                    [facility]                    Healthcare ---------------------------------------------------------------------- Orders   #  Description  Code   1  [place] MFM OB DETAIL +14 WK                     [id number]  ----------------------------------------------------------------------   #  Ordered By               Order #        Accession #    Episode #   1  Darron Doom              [id number]      [id number]     [id number]  ---------------------------------------------------------------------- Indications   [redacted] weeks gestation of pregnancy                Z3A.19   Encounter for antenatal screening for          Z36.3   malformations   Advanced maternal age multigravida 49+,        O57.522   second trimester (LOW risk NIPS)   Previous cesarean delivery, antepartum         O34.219   (subsequent VBAC 3846)   Obesity complicating pregnancy, second         O99.212   trimester  ---------------------------------------------------------------------- OB History  Gravidity:    4         Term:   3        Prem:   0        SAB:   0  TOP:          0       Ectopic:  0        Living: 3 ---------------------------------------------------------------------- Fetal Evaluation  Num Of Fetuses:     1  Fetal Heart         155  Rate(bpm):  Cardiac Activity:   Observed  Presentation:       Variable  Placenta:           Posterior, above cervical os  P. Cord Insertion:  Not well visualized  Amniotic Fluid  AFI FV:      Subjectively within normal limits                              Largest  Pocket(cm)                              4.54 ---------------------------------------------------------------------- Biometry  BPD:      38.4  mm     G. Age:  17w 5d          3  %    CI:        63.25   %    70 - 86                                                          FL/HC:      19.7   %    16.1 - 18.3  HC:      155.6  mm     G. Age:  18w 3d         12  %    HC/AC:      1.07        1.09 - 1.39  AC:      144.9  mm     G. Age:  19w 6d         63  %    FL/BPD:     79.9   %  FL:       30.7  mm     G. Age:  19w 4d         51  %    FL/AC:      21.2   %    20 - 24  HUM:      28.6  mm     G. Age:  19w 2d         50  %  CER:      18.9  mm     G. Age:  18w 3d         27  %  NFT:       3.6  mm  CM:        5.4  mm  Est. FW:     295  gm    0 lb 10 oz      50  % ---------------------------------------------------------------------- Gestational Age  LMP:           19w 2d        Date:  05/16/17                 EDD:   02/20/18  U/S Today:     18w 6d                                        EDD:   02/23/18  Best:          19w 2d     Det. By:  LMP  (05/16/17)          EDD:   02/20/18 ---------------------------------------------------------------------- Anatomy  Cranium:               Appears normal         LVOT:                   Appears normal  Cavum:                 Appears normal         Aortic Arch:            Appears normal  Ventricles:            Appears normal         Ductal Arch:            Appears normal  Choroid Plexus:        Appears normal         Diaphragm:              Appears normal  Cerebellum:            Appears normal         Stomach:                Appears normal, left  sided  Posterior Fossa:       Appears normal         Abdomen:                Appears normal  Nuchal Fold:           Appears normal         Abdominal Wall:         Appears nml (cord                                                                        insert, abd wall)  Face:                   Appears normal         Cord Vessels:           Appears normal (3                         (orbits and profile)                           vessel cord)  Lips:                  Appears normal         Kidneys:                Appear normal  Palate:                Not well visualized    Bladder:                Appears normal  Thoracic:              Appears normal         Spine:                  Appears normal  Heart:                 Appears normal         Upper Extremities:      Appears normal                         (4CH, axis, and situs  RVOT:                  Appears normal         Lower Extremities:      Appears normal  Other:  Fetus appears to be a female. Heels and 5th digit visualized. Nasal          bone visualized. Open hands visualized. ---------------------------------------------------------------------- Cervix Uterus Adnexa  Cervix  Length:           3.32  cm.  Normal appearance by transabdominal scan.  Left Ovary  Within normal limits.  Right Ovary  Not visualized.  Adnexa:       No abnormality visualized. ---------------------------------------------------------------------- Impression  Single living intrauterine pregnancy at 19w 2d.  Variable presentation.  Placenta Posterior, above cervical os.  Appropriate fetal growth.  Normal amniotic fluid volume.  The fetal anatomic survey is without demonstration  of defect  Normal fetal anatomy.  No fetal anomalies or soft markers of aneuploidy seen.  The adnexa appear normal bilaterally without masses.  The cervix measures 3.32cm on transabdominal imaging  without funneling.  Low risk NIPS ---------------------------------------------------------------------- Recommendations  Given AMA, interval growth in 6 weeks. ----------------------------------------------------------------------               Oralia Rud, MD Electronically Signed Final Report   09/28/2017 03:16 pm  ----------------------------------------------------------------------   Assessment and Plan:  Pregnancy: [id number] at [redacted]w[redacted]d  1. Multigravida of advanced maternal age in second trimester Normal anatomy, low risk female. Follow scan ordered by MFM.  2. Previous cesarean delivery affecting pregnancy, antepartum Leaning towards VBAC, information given to patient. Well sign consent next visit.   3. Supervision of high risk pregnancy, antepartum Will also soon BTL consent next visit.  Third trimester labs next visit. Declines Tdap.  No other complaints or concerns.  Routine obstetric precautions reviewed. Please refer to After Visit Summary for other counseling recommendations.  Return in about 8 weeks (around 11/25/2017) for 2 hr GTT, 3rd trimester labs, TSH, OB 28 week visit (Babyscripts).  Future Appointments  Date Time Provider [place]  11/09/2017  2:00 PM [facility] [place] 3 [facility]  11/17/2017 10:15 AM March Rummage, Christian Mate, DPM [facility]    Verita Schneiders, MD

## 2017-09-30 NOTE — Patient Instructions (Signed)
Vaginal Birth After Cesarean Delivery Vaginal birth after cesarean delivery (VBAC) is giving birth vaginally after previously delivering a baby by a cesarean. In the past, if a woman had a cesarean delivery, all births afterward would be done by cesarean delivery. This is no longer true. It can be safe for the mother to try a vaginal delivery after having a cesarean delivery. It is important to discuss VBAC with your health care provider early in the pregnancy so you can understand the risks, benefits, and options. It will give you time to decide what is best in your particular case. The final decision about whether to have a VBAC or repeat cesarean delivery should be between you and your health care provider. Any changes in your health or your baby's health during your pregnancy may make it necessary to change your initial decision about VBAC. Women who plan to have a VBAC should check with their health care provider to be sure that:  The previous cesarean delivery was done with a low transverse uterine cut (incision) (not a vertical classical incision).  The birth canal is big enough for the baby.  There were no other operations on the uterus.  An electronic fetal monitor (EFM) will be on at all times during labor.  An operating room will be available and ready in case an emergency cesarean delivery is needed.  A health care provider and surgical nursing staff will be available at all times during labor to be ready to do an emergency delivery cesarean if necessary.  An anesthesiologist will be present in case an emergency cesarean delivery is needed.  The nursery is prepared and has adequate personnel and necessary equipment available to care for the baby in case of an emergency cesarean delivery. Benefits of VBAC  Shorter stay in the hospital.  Avoidance of risks associated with cesarean delivery, such as: ? Surgical complications, such as opening of the incision or hernia in the  incision. ? Injury to other organs. ? Fever. This can occur if an infection develops after surgery. It can also occur as a reaction to the medicine given to make you numb during the surgery.  Less blood loss and need for blood transfusions.  Lower risk of blood clots and infection.  Shorter recovery.  Decreased risk for having to remove the uterus (hysterectomy).  Decreased risk for the placenta to completely or partially cover the opening of the uterus (placenta previa) with a future pregnancy.  Decrease risk in future labor and delivery. Risks of a VBAC  Tearing (rupture) of the uterus. This is occurs in less than 1% of VBACs. The risk of this happening is higher if: ? Steps are taken to begin the labor process (induce labor) or stimulate or strengthen contractions (augment labor). ? Medicine is used to soften (ripen) the cervix.  Having to remove the uterus (hysterectomy) if it ruptures. VBAC should not be done if:  The previous cesarean delivery was done with a vertical (classical) or T-shaped incision or you do not know what kind of incision was made.  You had a ruptured uterus.  You have had certain types of surgery on your uterus, such as removal of uterine fibroids. Ask your health care provider about other types of surgeries that prevent you from having a VBAC.  You have certain medical or childbirth (obstetrical) problems.  There are problems with the baby.  You have had two previous cesarean deliveries and no vaginal deliveries. Other facts to know about VBAC:  It   is safe to have an epidural anesthetic with VBAC.  It is safe to turn the baby from a breech position (attempt an external cephalic version).  It is safe to try a VBAC with twins.  VBAC may not be successful if your baby weights 8.8 lb (4 kg) or more. However, weight predictions are not always accurate and should not be used alone to decide if VBAC is right for you.  There is an increased failure rate  if the time between the cesarean delivery and VBAC is less than 19 months.  Your health care provider may advise against a VBAC if you have preeclampsia (high blood pressure, protein in the urine, and swelling of face and extremities).  VBAC is often successful if you previously gave birth vaginally.  VBAC is often successful when the labor starts spontaneously before the due date.  Delivering a baby through a VBAC is similar to having a normal spontaneous vaginal delivery. This information is not intended to replace advice given to you by your health care provider. Make sure you discuss any questions you have with your health care provider. Document Released: 09/12/2006 Document Revised: 08/28/2015 Document Reviewed: 10/19/2012 Elsevier Interactive Patient Education  2018 [employer].   Postpartum Tubal Ligation Postpartum tubal ligation (PPTL) is a procedure to close the fallopian tubes. This is done so that you cannot get pregnant. When the fallopian tubes are closed, the eggs that the ovaries release cannot enter the uterus, and sperm cannot reach the eggs. PPTL is done right after childbirth or 1-2 days after childbirth, before the uterus returns to its normal location. PPTL is sometimes called "getting your tubes tied." You should not have this procedure if you want to get pregnant someday or if you are unsure about having more children. Tell a health care provider about:  Any allergies you have.  All medicines you are taking, including vitamins, herbs, eye drops, creams, and over-the-counter medicines.  Previous problems you or members of your family have had with the use of anesthetics.  Any blood disorders you have.  Previous surgeries you have had.  Any medical conditions you may have.  Any past pregnancies. What are the risks? Generally, this is a safe procedure. However, problems may occur, including:  Infection.  Bleeding.  Injury to surrounding organs.  Side  effects from anesthetics.  Failure of the procedure.  This procedure can increase your risk of a kind of pregnancy in which a fertilized egg attaches to the outside of the uterus (ectopic pregnancy). What happens before the procedure?  Ask your health care provider about: ? How much pain you can expect to have. ? What medicines you will be given for pain, especially if you are planning to breastfeed.  Follow instructions from your health care provider about eating and drinking restrictions. What happens during the procedure? If you had a vaginal delivery:  You may be given one or more of the following: ? A medicine that helps you relax (sedative). ? A medicine to numb the area (local anesthetic). ? A medicine to make you fall asleep (general anesthetic). ? A medicine that is injected into an area of your body to numb everything below the injection site (regional anesthetic).  If you have been given a general anesthetic, a tube will be put down your throat to help you breathe.  An IV tube will be inserted into one of your veins to give you medicines and fluids during the procedure.  Your bladder may be emptied  with a small tube (catheter).  An incision will be made just below your belly button.  Your fallopian tubes will be located and brought up through the incision.  Your fallopian tubes will be tied off, burned (cauterized), or blocked with a clip, ring, or clamp. A small portion in the center of each fallopian tube may be removed.  The incision will be closed with stitches (sutures).  A bandage (dressing) will be placed over the incision.  If you had a cesarean delivery:  Tubal ligation will be done through the incision that was used for the cesarean delivery of your baby.  The incision will be closed with sutures.  A dressing will be placed over the incision.  The procedure may vary among health care providers and hospitals. What happens after the procedure?  Your  blood pressure, heart rate, breathing rate, and blood oxygen level will be monitored often until the medicines you were given have worn off.  You will be given pain medicine as needed.  Do not drive for 24 hours if you received a sedative. This information is not intended to replace advice given to you by your health care provider. Make sure you discuss any questions you have with your health care provider. Document Released: 03/22/2005 Document Revised: 08/25/2015 Document Reviewed: 03/02/2015 Elsevier Interactive Patient Education  2018 [employer]  Vasectomy is minor surgery to block sperm from reaching the semen that is ejaculated from the penis. Semen still exists, but it has no sperm in it. After a vasectomy the testes still make sperm, but they are soaked up by the body. Each year, more than 500,000 men in the [place] choose vasectomy for birth control. A vasectomy prevents pregnancy better than any other method of birth control, except abstinence. Only 1 to 2 women out of 1,000 will get pregnant in the year after their partners have had a vasectomy. What Happens under Normal Conditions? Both sperm and female sex hormones are made in the paired testes (testicles). The testes are in the scrotum at the base of the penis. Sperm leave the testes through a coiled tube (the "epididymis"), where they stay until they're ready for use. Each epididymis is linked to the prostate by a long tube called the vas deferens (or "vas"). The vas runs from the lower part of the scrotum into the inguinal canal. It then goes into the pelvis and behind the bladder. This is where the vas deferens joins with the seminal vesicle and forms the ejaculatory duct. When you ejaculate, seminal fluid and seminal vesicles mix with sperm to form semen. The semen flows through the urethra and comes out the end of your penis.  Treatment Vasectomies are often done in your urologist's office. But they may also be done at  a surgery center or in a hospital. You and your urologist may decide you need to be fully sedated (put to sleep) for the procedure. If you need to be sedated, you may have your vasectomy at a surgery center or hospital. The need for sedation is based on your anatomy, how nervous you are, or if you might need other surgery at the same time. You'll be asked to sign a form that gives your urologist permission to do the procedure. Some states have special laws about the type of consent and when you need to sign it. In the procedure room, your scrotal area will be shaved and washed with an antiseptic solution. Local anesthesia will be injected to numb  the area, but you'll be aware of touch, tension, and movement. The local anesthetic should block any sharp pain. If you feel pain during the procedure, you can let your urologist know so you can get more anesthesia. Conventional Vasectomy For a conventional vasectomy, 1 or 2 small cuts are made in the skin of the scrotum to reach the vas deferens. The vas deferens is cut and a small piece may be removed, leaving a short gap between the 2 ends. Next, the urologist may sear the ends of the vas, and then tie the cut ends with a suture. These steps are then repeated on the other vas, either through the same cut or through a new one. The scrotal cuts may be closed with dissolvable stitches or allowed to close on their own. No-Scalpel Vasectomy For a no-scalpel vasectomy, the urologist feels for the vas under the skin of the scrotum and holds it in place with a small clamp. A tiny hole is made in the skin and stretched open so the vas deferens can be gently lifted out. It is then cut, tied or seared, and put back in place. What are the Risks? Right after surgery, there's a small risk of bleeding into the scrotum. If you notice that your scrotum has gotten much bigger or you're in pain, call your urologist at once. If you have a fever, or your scrotum is red or sore, you  should have your urologist check for infection. There is a small risk for post-vasectomy pain syndrome. Post-vasectomy pain syndrome is a steady pain that can follow a vasectomy. It isn't clear what causes this, but it's most often treated with anti-swelling meds. Sometimes men will choose to have the vasectomy undone to try to stop the pain. Having the vasectomy undone doesn't always solve the problem. Studies show men who've had a vasectomy aren't at a higher risk for heart disease, prostate cancer, testicular cancer, or other health problems.  After Treatment You may be uncomfortable after your vasectomy. You may need mild pain meds to take care of any pain. Severe pain may suggest infection or other problems, and you should see your urologist. You may have mild pain like what you'd feel like several minutes after getting hit "down there." A benign lump (granuloma) may form from sperm leaking from the cut end of the vas into the scrotal tissues. It may be painful or sensitive to touch or pressure, but it isn't harmful. This usually gets better with time. Your urologist will give you instructions for care after a vasectomy. You should go home right away after the procedure. You should avoid sex or activities that take a lot of strength. Swelling and pain can be treated with an ice pack on the scrotum and wearing a supportive undergarment, such as a jockstrap. Most men heal fully in less than a week. Many men are able to return to their job as early as the next day. Sex can often be resumed within a week after the vasectomy. But it's important to know that a vasectomy doesn't work right away. After the vasectomy, new sperm won't be able to get into the semen, but there will still be lots of sperm "in the pipeline" that takes time to clear. You will have to follow up with your urologist for semen analysis to check for sperm in your ejaculate. During this time, you should use other forms of birth control. The  time it takes for your ejaculate to be free of sperm can  differ. Most urologists suggest waiting to check the semen for at least 3 months or 20 ejaculates, whichever comes first. One in 5 men will still have sperm in their ejaculate at that time, and will need to wait longer for the sperm to clear. You shouldn't assume that your vasectomy is effective until a semen analysis proves it is.  More Information Frequently Asked Questions Can my partner tell if I have had a vasectomy? Sperm adds very little to the semen volume, so you shouldn't notice any change in your ejaculate after vasectomy. Your partner may sometimes be able to feel the vasectomy site. This is particularly true if you have developed a granuloma. Will my sense of orgasm be changed by having a vasectomy? Ejaculation and orgasm are usually not affected by vasectomy. The special case is the rare man who has developed post-vasectomy pain syndrome. Can I become impotent after a vasectomy? An uncomplicated vasectomy can't cause impotence. Can a vasectomy fail? There is a small chance that a vasectomy may fail. This occurs when sperm leaking from one end of the cut vas deferens find a channel to the other cut end. Can something happen to my testicles? In rare cases, the testicular artery may be hurt during vasectomy. Other problems, such as a mass of blood (hematoma) or infection, may also affect the testicles. Can I have children after my vasectomy? Yes, but if you haven't stored frozen sperm you'll need an additional procedure. The vas deferens can be microsurgically reconnected in a procedure called vasectomy reversal. If you don't want to have vasectomy reversal, sperm can be taken from the testicle or the epididymis and used for in vitro fertilization. These procedures are costly and may not be covered by your health plan. Also, they don't always work. If you think you may want to have children one day, you should look into nonsurgical  forms of birth control before deciding to have a vasectomy.

## 2017-10-24 ENCOUNTER — Ambulatory Visit (INDEPENDENT_AMBULATORY_CARE_PROVIDER_SITE_OTHER): Payer: No Typology Code available for payment source | Admitting: Family Medicine

## 2017-10-24 VITALS — BP 110/76 | HR 78 | Wt 195.4 lb

## 2017-10-24 DIAGNOSIS — N764 Abscess of vulva: Secondary | ICD-10-CM | POA: Diagnosis not present

## 2017-10-24 MED ORDER — SULFAMETHOXAZOLE-TRIMETHOPRIM 800-160 MG PO TABS: 1.0000 | ORAL_TABLET | Freq: Two times a day (BID) | ORAL | 1 refills | Status: DC

## 2017-10-24 NOTE — Progress Notes (Signed)
Patient seen for right labial pain. Started 1 week ago. Initially size of dime, now size of ping pong ball.   Lidocain with epi 30mL injected. Incised and drained. Culture obtained. No packing needed. Bactrim prescribed. Hot compresses. Follow up if worsens.

## 2017-10-27 LAB — WOUND CULTURE

## 2017-11-02 ENCOUNTER — Telehealth: Payer: Self-pay

## 2017-11-02 NOTE — Telephone Encounter (Signed)
Returned patient call - [place] re her question/concern. Advised that she could utilize mycharts as well.

## 2017-11-02 NOTE — Telephone Encounter (Signed)
-----   Message from Blanchie Dessert, [place] sent at 11/02/2017  8:59 AM EDT ----- Regarding: please call patient  Patient requesting a call back she needs to speak with clinical

## 2017-11-09 ENCOUNTER — Ambulatory Visit (HOSPITAL_COMMUNITY)
Admission: RE | Admit: 2017-11-09 | Discharge: 2017-11-09 | Disposition: A | Discharge status: Home / Self Care | Payer: Non-veteran care | Source: Ambulatory Visit | Attending: Family Medicine | Admitting: Family Medicine

## 2017-11-09 ENCOUNTER — Other Ambulatory Visit (HOSPITAL_COMMUNITY): Payer: Self-pay | Admitting: Obstetrics and Gynecology

## 2017-11-09 ENCOUNTER — Encounter (HOSPITAL_COMMUNITY): Payer: Self-pay

## 2017-11-09 DIAGNOSIS — Z3A25 25 weeks gestation of pregnancy: Secondary | ICD-10-CM | POA: Diagnosis not present

## 2017-11-09 DIAGNOSIS — O09522 Supervision of elderly multigravida, second trimester: Secondary | ICD-10-CM

## 2017-11-09 DIAGNOSIS — O34219 Maternal care for unspecified type scar from previous cesarean delivery: Secondary | ICD-10-CM

## 2017-11-09 DIAGNOSIS — Z363 Encounter for antenatal screening for malformations: Secondary | ICD-10-CM | POA: Diagnosis not present

## 2017-11-09 DIAGNOSIS — O0990 Supervision of high risk pregnancy, unspecified, unspecified trimester: Secondary | ICD-10-CM

## 2017-11-09 DIAGNOSIS — O99210 Obesity complicating pregnancy, unspecified trimester: Secondary | ICD-10-CM

## 2017-11-09 DIAGNOSIS — R7989 Other specified abnormal findings of blood chemistry: Secondary | ICD-10-CM

## 2017-11-17 ENCOUNTER — Ambulatory Visit: Payer: Non-veteran care | Admitting: Podiatry

## 2017-11-25 ENCOUNTER — Ambulatory Visit (INDEPENDENT_AMBULATORY_CARE_PROVIDER_SITE_OTHER): Payer: Non-veteran care | Admitting: Family Medicine

## 2017-11-25 VITALS — BP 110/69 | HR 72 | Wt 199.2 lb

## 2017-11-25 DIAGNOSIS — O09522 Supervision of elderly multigravida, second trimester: Secondary | ICD-10-CM

## 2017-11-25 DIAGNOSIS — R7989 Other specified abnormal findings of blood chemistry: Secondary | ICD-10-CM

## 2017-11-25 DIAGNOSIS — O0990 Supervision of high risk pregnancy, unspecified, unspecified trimester: Secondary | ICD-10-CM

## 2017-11-25 DIAGNOSIS — O34219 Maternal care for unspecified type scar from previous cesarean delivery: Secondary | ICD-10-CM

## 2017-11-25 NOTE — Patient Instructions (Signed)

## 2017-11-25 NOTE — Progress Notes (Signed)
Decline flu

## 2017-11-25 NOTE — Progress Notes (Signed)
   PRENATAL VISIT NOTE  Subjective:  Brittany Vasquez is a 37 y.o. G4P3003 at [redacted]w[redacted]d being seen today for ongoing prenatal care.  She is currently monitored for the following issues for this low-risk pregnancy and has Previous cesarean delivery affecting pregnancy, antepartum; Supervision of high risk pregnancy, antepartum; AMA (advanced maternal age) multigravida 35+; Obesity in pregnancy, antepartum; and Abnormal TSH on their problem list.  Patient reports no complaints.  Contractions: Not present.  .  Movement: Present. Denies leaking of fluid.   The following portions of the patient's history were reviewed and updated as appropriate: allergies, current medications, past family history, past medical history, past social history, past surgical history and problem list. Problem list updated.  Objective:   Vitals:   11/25/17 0817  BP: 110/69  Pulse: 72  Weight: 199 lb 3.2 oz (90.4 kg)    Fetal Status: Fetal Heart Rate (bpm): 144 Fundal Height: 28 cm Movement: Present     General:  Alert, oriented and cooperative. Patient is in no acute distress.  Skin: Skin is warm and dry. No rash noted.   Cardiovascular: Normal heart rate noted  Respiratory: Normal respiratory effort, no problems with respiration noted  Abdomen: Soft, gravid, appropriate for gestational age.  Pain/Pressure: Absent     Pelvic: Cervical exam deferred        Extremities: Normal range of motion.  Edema: None  Mental Status: Normal mood and affect. Normal behavior. Normal judgment and thought content.   Assessment and Plan:  Pregnancy: G4P3003 at [redacted]w[redacted]d  1. Multigravida of advanced maternal age in second trimester nml NIPT  2. Previous cesarean delivery affecting pregnancy, antepartum For TOLAC--consent signed  3. Supervision of high risk pregnancy, antepartum 28 wks labs today Declined flu and TDaP Complaint with Babyscripts - RPR; Future - CBC; Future - HIV antibody; Future - Glucose Tolerance, 2 Hours  w/1 Hour  4. Abnormal TSH Recheck today - TSH  Preterm labor symptoms and general obstetric precautions including but not limited to vaginal bleeding, contractions, leaking of fluid and fetal movement were reviewed in detail with the patient. Please refer to After Visit Summary for other counseling recommendations.  Return in 4 weeks (on 12/23/2017).  Future Appointments  Date Time Provider [place]  12/01/2017 10:15 AM Evelina Bucy, DPM [facility]    Donnamae Jude, MD

## 2017-11-26 LAB — TSH: TSH: 1.38 u[IU]/mL (ref 0.450–4.500)

## 2017-11-26 LAB — GLUCOSE TOLERANCE, 2 HOURS W/ 1HR
GLUCOSE, 1 HOUR: 121 mg/dL (ref 65–179)
Glucose, 2 hour: 99 mg/dL (ref 65–152)
Glucose, Fasting: 74 mg/dL (ref 65–91)

## 2017-12-01 ENCOUNTER — Ambulatory Visit: Payer: Non-veteran care | Admitting: Podiatry

## 2017-12-08 ENCOUNTER — Ambulatory Visit (INDEPENDENT_AMBULATORY_CARE_PROVIDER_SITE_OTHER): Payer: Medicare Other | Admitting: Podiatry

## 2017-12-08 DIAGNOSIS — Q828 Other specified congenital malformations of skin: Secondary | ICD-10-CM

## 2017-12-08 DIAGNOSIS — M79672 Pain in left foot: Secondary | ICD-10-CM

## 2017-12-08 DIAGNOSIS — M7742 Metatarsalgia, left foot: Secondary | ICD-10-CM | POA: Diagnosis not present

## 2017-12-08 DIAGNOSIS — M21962 Unspecified acquired deformity of left lower leg: Secondary | ICD-10-CM

## 2017-12-08 NOTE — Progress Notes (Signed)
  Subjective:  Patient ID: Brittany Vasquez, female    DOB: 1980/10/19,  MRN: [id number]  Chief Complaint  Patient presents with  . Callouses    6 wk lesion f/u - left 4th metatarsal    37 y.o. female presents with the above complaint. New callus and pain to the forefoot. Not having pain in the heel anymore.   Review of Systems: Negative except as noted in the HPI. Denies N/V/F/Ch.  Past Medical History:  Diagnosis Date  . Fibromyalgia   . Mass of perirectal soft tissue - prob seb cyst 05/22/2012  . Ulcerative colitis ([place])     Current Outpatient Medications:  .  calcium-vitamin D 250-100 MG-UNIT tablet, Take 1 tablet by mouth 2 (two) times daily., Disp: 60 tablet, Rfl: 6 .  flintstones complete (FLINTSTONES) 60 MG chewable tablet, Chew 1 tablet by mouth daily., Disp: , Rfl:   Social History   Tobacco Use  Smoking Status Never Smoker  Smokeless Tobacco Never Used    No Known Allergies Objective:  There were no vitals filed for this visit. There is no height or weight on file to calculate BMI. Constitutional Well developed. Well nourished.  Vascular Dorsalis pedis pulses palpable bilaterally. Posterior tibial pulses palpable bilaterally. Capillary refill normal to all digits.  No cyanosis or clubbing noted. Pedal hair growth normal.  Neurologic Normal speech. Oriented to person, place, and time. Epicritic sensation to light touch grossly present bilaterally.  Dermatologic Nails well groomed and normal in appearance. No open wounds. HPK L submet 4 tender to palpation. HPK L heel but non-tender  Orthopedic: Normal joint ROM without pain or crepitus bilaterally. No visible deformities. No bony tenderness.   Radiographs: None today Assessment:   1. Metatarsalgia of left foot   2. Porokeratosis   3. Deformity of metatarsal bone of left foot    Plan:  Patient was evaluated and treated and all questions answered.  Metatarsalgia, Porokeratosis -Lesion debrided  x1, salinocaine applied. -Would benefit from CMOs with offloading of the HPKs. -Pads dispensed.  Return in about 6 weeks (around 01/19/2018) for Metatarsalgia .

## 2017-12-23 ENCOUNTER — Ambulatory Visit (INDEPENDENT_AMBULATORY_CARE_PROVIDER_SITE_OTHER): Payer: Non-veteran care | Admitting: Obstetrics and Gynecology

## 2017-12-23 VITALS — BP 107/76 | HR 72 | Wt 202.8 lb

## 2017-12-23 DIAGNOSIS — O0990 Supervision of high risk pregnancy, unspecified, unspecified trimester: Secondary | ICD-10-CM

## 2017-12-23 DIAGNOSIS — O34219 Maternal care for unspecified type scar from previous cesarean delivery: Secondary | ICD-10-CM

## 2017-12-23 DIAGNOSIS — O09523 Supervision of elderly multigravida, third trimester: Secondary | ICD-10-CM

## 2017-12-23 DIAGNOSIS — O0993 Supervision of high risk pregnancy, unspecified, third trimester: Secondary | ICD-10-CM

## 2017-12-24 LAB — CBC
Hematocrit: 32.4 % — ABNORMAL LOW (ref 34.0–46.6)
Hemoglobin: 10.7 g/dL — ABNORMAL LOW (ref 11.1–15.9)
MCH: 26 pg — AB (ref 26.6–33.0)
MCHC: 33 g/dL (ref 31.5–35.7)
MCV: 79 fL (ref 79–97)
PLATELETS: 188 10*3/uL (ref 150–450)
RBC: 4.11 x10E6/uL (ref 3.77–5.28)
RDW: 13.9 % (ref 12.3–15.4)
WBC: 8.8 10*3/uL (ref 3.4–10.8)

## 2017-12-24 LAB — HIV ANTIBODY (ROUTINE TESTING W REFLEX): HIV Screen 4th Generation wRfx: NONREACTIVE

## 2017-12-24 LAB — RPR: RPR: NONREACTIVE

## 2017-12-26 NOTE — Progress Notes (Signed)
Prenatal Visit Note Date: 12/23/2017 Clinic: [facility]  Subjective:  Brittany Vasquez is a 37 y.o. [phone] at [redacted]w[redacted]d being seen today for ongoing prenatal care.  She is currently monitored for the following issues for this low-risk pregnancy and has Previous cesarean delivery affecting pregnancy, antepartum; Supervision of high risk pregnancy, antepartum; AMA (advanced maternal age) multigravida 35+; and Obesity in pregnancy, antepartum on their problem list.  Patient reports no complaints.   Contractions: Not present.  .  Movement: Present. Denies leaking of fluid.   The following portions of the patient's history were reviewed and updated as appropriate: allergies, current medications, past family history, past medical history, past social history, past surgical history and problem list. Problem list updated.  Objective:   Vitals:   12/23/17 1055  BP: 107/76  Pulse: 72  Weight: 202 lb 12.8 oz (92 kg)    Fetal Status: Fetal Heart Rate (bpm): 142 Fundal Height: 31 cm Movement: Present     General:  Alert, oriented and cooperative. Patient is in no acute distress.  Skin: Skin is warm and dry. No rash noted.   Cardiovascular: Normal heart rate noted  Respiratory: Normal respiratory effort, no problems with respiration noted  Abdomen: Soft, gravid, appropriate for gestational age. Pain/Pressure: Absent     Pelvic:  Cervical exam deferred        Extremities: Normal range of motion.  Edema: None  Mental Status: Normal mood and affect. Normal behavior. Normal judgment and thought content.   Urinalysis:      Assessment and Plan:  Pregnancy: G4P3003 at [redacted]w[redacted]d  1. Supervision of high risk pregnancy, antepartum Routine care. I don't see the below labs back and [place] doesn't see them; will re draw. I talked with J. Battle and with her insurance, she doesn't need to sign papers.  - HIV antibody - CBC - RPR  2. Previous cesarean delivery affecting pregnancy,  antepartum tolac consent already signed   Preterm labor symptoms and general obstetric precautions including but not limited to vaginal bleeding, contractions, leaking of fluid and fetal movement were reviewed in detail with the patient. Please refer to After Visit Summary for other counseling recommendations.  Return in about 2 weeks (around 01/06/2018) for rob.   Aletha Halim, MD

## 2018-01-05 ENCOUNTER — Ambulatory Visit (INDEPENDENT_AMBULATORY_CARE_PROVIDER_SITE_OTHER): Payer: Non-veteran care | Admitting: Obstetrics & Gynecology

## 2018-01-05 VITALS — BP 94/61 | HR 77 | Wt 204.0 lb

## 2018-01-05 DIAGNOSIS — O34219 Maternal care for unspecified type scar from previous cesarean delivery: Secondary | ICD-10-CM

## 2018-01-05 DIAGNOSIS — Z3483 Encounter for supervision of other normal pregnancy, third trimester: Secondary | ICD-10-CM

## 2018-01-05 NOTE — Patient Instructions (Signed)
Return to clinic for any scheduled appointments or obstetric concerns, or go to MAU for evaluation  

## 2018-01-05 NOTE — Progress Notes (Signed)
   PRENATAL VISIT NOTE  Subjective:  Brittany Vasquez is a 37 y.o. [phone] at [redacted]w[redacted]d being seen today for ongoing prenatal care.  She is currently monitored for the following issues for this low-risk pregnancy and has Previous cesarean delivery affecting pregnancy, antepartum; Supervision of normal pregnancy; AMA (advanced maternal age) multigravida 35+; and Obesity in pregnancy, antepartum on their problem list.  Patient reports irregular, sometimes strong contractions.  Contractions: Irregular. Vag. Bleeding: None.  Movement: Present. Denies leaking of fluid.   The following portions of the patient's history were reviewed and updated as appropriate: allergies, current medications, past family history, past medical history, past social history, past surgical history and problem list. Problem list updated.  Objective:   Vitals:   01/05/18 1139  BP: 94/61  Pulse: 77  Weight: 204 lb (92.5 kg)    Fetal Status: Fetal Heart Rate (bpm): 143 Fundal Height: 33 cm Movement: Present     General:  Alert, oriented and cooperative. Patient is in no acute distress.  Skin: Skin is warm and dry. No rash noted.   Cardiovascular: Normal heart rate noted  Respiratory: Normal respiratory effort, no problems with respiration noted  Abdomen: Soft, gravid, appropriate for gestational age.  Pain/Pressure: Absent     Pelvic: Cervical exam deferred        Extremities: Normal range of motion.  Edema: None  Mental Status: Normal mood and affect. Normal behavior. Normal judgment and thought content.   Assessment and Plan:  Pregnancy: G4P3003 at [redacted]w[redacted]d  1. Previous cesarean delivery affecting pregnancy, antepartum Already signed VBAC consent.  2. Encounter for supervision of other normal pregnancy in third trimester Declines cervical exam at this point.  Preterm labor symptoms and general obstetric precautions including but not limited to vaginal bleeding, contractions, leaking of fluid and fetal movement  were reviewed in detail with the patient. Please refer to After Visit Summary for other counseling recommendations.  Return in about 3 weeks (around 01/26/2018) for OB 36 week visit (Babyscripts), Pelvic cultures.  Future Appointments  Date Time Provider No Name  01/19/2018  9:45 AM Evelina Bucy, DPM [facility]  01/24/2018 11:00 AM Eupha Lobb, Sallyanne Havers, MD [facility]    Verita Schneiders, MD

## 2018-01-19 ENCOUNTER — Ambulatory Visit (INDEPENDENT_AMBULATORY_CARE_PROVIDER_SITE_OTHER): Payer: Medicare Other | Admitting: Podiatry

## 2018-01-19 DIAGNOSIS — M7742 Metatarsalgia, left foot: Secondary | ICD-10-CM | POA: Diagnosis not present

## 2018-01-19 DIAGNOSIS — M21962 Unspecified acquired deformity of left lower leg: Secondary | ICD-10-CM

## 2018-01-19 DIAGNOSIS — Q828 Other specified congenital malformations of skin: Secondary | ICD-10-CM

## 2018-01-19 NOTE — Progress Notes (Signed)
  Subjective:  Patient ID: Brittany Vasquez, female    DOB: March 19, 1981,  MRN: [id number]  Chief Complaint  Patient presents with  . Foot Pain    f/u on left foot pain. Pt is not feeling better since her last visit.    37 y.o. female presents with the above complaint. Callus is hurting more than previous. Lost her orthotic prescription.  Review of Systems: Negative except as noted in the HPI. Denies N/V/F/Ch.  Past Medical History:  Diagnosis Date  . Fibromyalgia   . Mass of perirectal soft tissue - prob seb cyst 05/22/2012  . Ulcerative colitis ([place])     Current Outpatient Medications:  .  calcium-vitamin D 250-100 MG-UNIT tablet, Take 1 tablet by mouth 2 (two) times daily., Disp: 60 tablet, Rfl: 6 .  flintstones complete (FLINTSTONES) 60 MG chewable tablet, Chew 1 tablet by mouth daily., Disp: , Rfl:   Social History   Tobacco Use  Smoking Status Never Smoker  Smokeless Tobacco Never Used    No Known Allergies Objective:  There were no vitals filed for this visit. There is no height or weight on file to calculate BMI. Constitutional Well developed. Well nourished.  Vascular Dorsalis pedis pulses palpable bilaterally. Posterior tibial pulses palpable bilaterally. Capillary refill normal to all digits.  No cyanosis or clubbing noted. Pedal hair growth normal.  Neurologic Normal speech. Oriented to person, place, and time. Epicritic sensation to light touch grossly present bilaterally.  Dermatologic Nails well groomed and normal in appearance. No open wounds. HPK L submet 4 tender to palpation. HPK L heel but non-tender  Orthopedic: Normal joint ROM without pain or crepitus bilaterally. No visible deformities. POP L subment 4.   Radiographs: None today Assessment:   1. Metatarsalgia of left foot   2. Porokeratosis   3. Deformity of metatarsal bone of left foot    Plan:  Patient was evaluated and treated and all questions answered.  Metatarsalgia,  Porokeratosis -Lesion debrided and salinocaine applied to soften lesion.  -Would benefit from custom molded orthotics. Will offload the 4th metatarsal head. -Pads dispensed.  Return in about 4 weeks (around 02/16/2018) for Metatarsalgia .

## 2018-01-24 ENCOUNTER — Ambulatory Visit (INDEPENDENT_AMBULATORY_CARE_PROVIDER_SITE_OTHER): Payer: Non-veteran care | Admitting: Obstetrics & Gynecology

## 2018-01-24 VITALS — BP 103/69 | HR 88 | Wt 205.0 lb

## 2018-01-24 DIAGNOSIS — Z3480 Encounter for supervision of other normal pregnancy, unspecified trimester: Secondary | ICD-10-CM

## 2018-01-24 DIAGNOSIS — O34219 Maternal care for unspecified type scar from previous cesarean delivery: Secondary | ICD-10-CM

## 2018-01-24 LAB — OB RESULTS CONSOLE GC/CHLAMYDIA: Gonorrhea: NEGATIVE

## 2018-01-24 NOTE — Progress Notes (Signed)
   PRENATAL VISIT NOTE  Subjective:  Brittany Vasquez is a 37 y.o. G4P3003 at [redacted]w[redacted]d being seen today for ongoing prenatal care.  She is currently monitored for the following issues for this low-risk pregnancy and has Previous cesarean delivery affecting pregnancy, antepartum; Supervision of normal pregnancy; AMA (advanced maternal age) multigravida 35+; and Obesity in pregnancy, antepartum on their problem list.  Patient reports increased vaginal discharge, wants evaluation.  Contractions: Irregular. Vag. Bleeding: None.  Movement: Present. Denies leaking of fluid.   The following portions of the patient's history were reviewed and updated as appropriate: allergies, current medications, past family history, past medical history, past social history, past surgical history and problem list. Problem list updated.  Objective:   Vitals:   01/24/18 1112  BP: 103/69  Pulse: 88  Weight: 205 lb (93 kg)    Fetal Status: Fetal Heart Rate (bpm): 141 Fundal Height: 36 cm Movement: Present  Presentation: Vertex  General:  Alert, oriented and cooperative. Patient is in no acute distress.  Skin: Skin is warm and dry. No rash noted.   Cardiovascular: Normal heart rate noted  Respiratory: Normal respiratory effort, no problems with respiration noted  Abdomen: Soft, gravid, appropriate for gestational age.  Pain/Pressure: Present     Pelvic: Cervical exam deferred        Extremities: Normal range of motion.  Edema: None  Mental Status: Normal mood and affect. Normal behavior. Normal judgment and thought content.   Assessment and Plan:  Pregnancy: G4P3003 at [redacted]w[redacted]d  1. Previous cesarean delivery affecting pregnancy, antepartum Desires TOLAC, consent signed 11/25/17.  2. Supervision of other normal pregnancy, antepartum Pelvic cultures done. - Strep Gp B NAA - Cervicovaginal ancillary only Preterm labor symptoms and general obstetric precautions including but not limited to vaginal bleeding,  contractions, leaking of fluid and fetal movement were reviewed in detail with the patient. Please refer to After Visit Summary for other counseling recommendations.  Return in about 2 weeks (around 02/07/2018) for OB 38 week visit (Babyscripts).  Future Appointments  Date Time Provider [place]  02/07/2018 11:15 AM Ruqayya Ventress, Sallyanne Havers, MD [facility]  02/09/2018 11:15 AM Evelina Bucy, DPM [facility]  02/14/2018 11:15 AM Donnamae Jude, MD [facility]  02/21/2018  9:30 AM Donnamae Jude, MD [facility]    Verita Schneiders, MD

## 2018-01-24 NOTE — Patient Instructions (Signed)
Return to clinic for any scheduled appointments or obstetric concerns, or go to MAU for evaluation  

## 2018-01-25 LAB — CERVICOVAGINAL ANCILLARY ONLY
BACTERIAL VAGINITIS: NEGATIVE
Candida vaginitis: NEGATIVE
Chlamydia: NEGATIVE
NEISSERIA GONORRHEA: NEGATIVE
TRICH (WINDOWPATH): NEGATIVE

## 2018-01-26 ENCOUNTER — Encounter: Payer: Self-pay | Admitting: Obstetrics & Gynecology

## 2018-01-26 DIAGNOSIS — O99820 Streptococcus B carrier state complicating pregnancy: Secondary | ICD-10-CM | POA: Insufficient documentation

## 2018-01-26 LAB — STREP GP B NAA: STREP GROUP B AG: POSITIVE — AB

## 2018-02-07 ENCOUNTER — Encounter: Payer: Self-pay | Admitting: Obstetrics & Gynecology

## 2018-02-07 ENCOUNTER — Ambulatory Visit (INDEPENDENT_AMBULATORY_CARE_PROVIDER_SITE_OTHER): Payer: Non-veteran care | Admitting: Obstetrics & Gynecology

## 2018-02-07 VITALS — BP 98/63 | HR 76 | Wt 206.0 lb

## 2018-02-07 DIAGNOSIS — O99820 Streptococcus B carrier state complicating pregnancy: Secondary | ICD-10-CM

## 2018-02-07 DIAGNOSIS — O34219 Maternal care for unspecified type scar from previous cesarean delivery: Secondary | ICD-10-CM

## 2018-02-07 DIAGNOSIS — Z3480 Encounter for supervision of other normal pregnancy, unspecified trimester: Secondary | ICD-10-CM

## 2018-02-07 NOTE — Progress Notes (Signed)
   PRENATAL VISIT NOTE  Subjective:  Brittany Vasquez is a 37 y.o. G4P3003 at [redacted]w[redacted]d being seen today for ongoing prenatal care.  She is currently monitored for the following issues for this low-risk pregnancy and has Previous cesarean delivery affecting pregnancy, antepartum; Supervision of normal pregnancy; AMA (advanced maternal age) multigravida 35+; Obesity in pregnancy, antepartum; and Group B Streptococcus carrier, +RV culture, currently pregnant on their problem list.  Patient reports no complaints.  Contractions: Irregular. Vag. Bleeding: None.  Movement: Present. Denies leaking of fluid.   The following portions of the patient's history were reviewed and updated as appropriate: allergies, current medications, past family history, past medical history, past social history, past surgical history and problem list. Problem list updated.  Objective:   Vitals:   02/07/18 1041  BP: 98/63  Pulse: 76  Weight: 206 lb (93.4 kg)    Fetal Status: Fetal Heart Rate (bpm): 143 Fundal Height: 39 cm Movement: Present     General:  Alert, oriented and cooperative. Patient is in no acute distress.  Skin: Skin is warm and dry. No rash noted.   Cardiovascular: Normal heart rate noted  Respiratory: Normal respiratory effort, no problems with respiration noted  Abdomen: Soft, gravid, appropriate for gestational age.  Pain/Pressure: Present     Pelvic: Cervical exam deferred        Extremities: Normal range of motion.  Edema: None  Mental Status: Normal mood and affect. Normal behavior. Normal judgment and thought content.   Results for orders placed or performed in visit on 01/24/18 (from the past 504 hour(s))  Cervicovaginal ancillary only   Collection Time: 01/24/18 12:00 AM  Result Value Ref Range   Bacterial vaginitis Negative for Bacterial Vaginitis Microorganisms    Candida vaginitis Negative for Candida species    Chlamydia Negative    Neisseria gonorrhea Negative    Trichomonas  Negative   Strep Gp B NAA   Collection Time: 01/24/18 11:30 AM  Result Value Ref Range   Strep Gp B NAA Positive (A) Negative    Assessment and Plan:  Pregnancy: G4P3003 at [redacted]w[redacted]d  1. Previous cesarean delivery affecting pregnancy, antepartum Desires TOLAC, consent signed 11/25/17.  2. Group B Streptococcus carrier, +RV culture, currently pregnant Results discussed with patient, needs intrapartum prophylaxis.  Patients WANTS AMPICILLIN (runs in faster, does not want to be hooked to IV for long time, h/o fast labors). She was told that this preference could be done for her, it is adequate prophylaxis for GBS.  3. Supervision of other normal pregnancy, antepartum Term labor symptoms and general obstetric precautions including but not limited to vaginal bleeding, contractions, leaking of fluid and fetal movement were reviewed in detail with the patient. Please refer to After Visit Summary for other counseling recommendations.  Return in about 1 week (around 02/14/2018) for OB 39 week visit (Babyscripts).  Future Appointments  Date Time Provider [place]  02/07/2018 11:15 AM Nadav Swindell, Sallyanne Havers, MD [facility]  02/09/2018 11:15 AM Evelina Bucy, DPM [facility]  02/14/2018  9:30 AM Velora Heckler [facility]  02/14/2018 11:15 AM Donnamae Jude, MD [facility]  02/21/2018  9:30 AM Donnamae Jude, MD [facility]    Verita Schneiders, MD

## 2018-02-07 NOTE — Patient Instructions (Signed)
Return to clinic for any scheduled appointments or obstetric concerns, or go to MAU for evaluation  

## 2018-02-09 ENCOUNTER — Ambulatory Visit (INDEPENDENT_AMBULATORY_CARE_PROVIDER_SITE_OTHER): Payer: Non-veteran care | Admitting: Podiatry

## 2018-02-09 ENCOUNTER — Encounter: Payer: Self-pay | Admitting: Podiatry

## 2018-02-09 DIAGNOSIS — M7742 Metatarsalgia, left foot: Secondary | ICD-10-CM

## 2018-02-09 DIAGNOSIS — M2142 Flat foot [pes planus] (acquired), left foot: Secondary | ICD-10-CM

## 2018-02-09 DIAGNOSIS — M2141 Flat foot [pes planus] (acquired), right foot: Secondary | ICD-10-CM | POA: Diagnosis not present

## 2018-02-09 DIAGNOSIS — M21962 Unspecified acquired deformity of left lower leg: Secondary | ICD-10-CM | POA: Diagnosis not present

## 2018-02-09 DIAGNOSIS — Q828 Other specified congenital malformations of skin: Secondary | ICD-10-CM

## 2018-02-09 NOTE — Progress Notes (Signed)
  Subjective:  Patient ID: Brittany Vasquez, female    DOB: 10-Jun-1980,  MRN: [id number]  No chief complaint on file.   37 y.o. female presents for follow up foot pain. Lesion has recurred and starting to cause her pain. Pads helped.  Review of Systems: Negative except as noted in the HPI. Denies N/V/F/Ch.  Past Medical History:  Diagnosis Date  . Fibromyalgia   . Mass of perirectal soft tissue - prob seb cyst 05/22/2012  . Ulcerative colitis ([place])     Current Outpatient Medications:  .  calcium-vitamin D 250-100 MG-UNIT tablet, Take 1 tablet by mouth 2 (two) times daily., Disp: 60 tablet, Rfl: 6 .  flintstones complete (FLINTSTONES) 60 MG chewable tablet, Chew 1 tablet by mouth daily., Disp: , Rfl:   Social History   Tobacco Use  Smoking Status Never Smoker  Smokeless Tobacco Never Used    No Known Allergies Objective:  There were no vitals filed for this visit. There is no height or weight on file to calculate BMI. Constitutional Well developed. Well nourished.  Vascular Dorsalis pedis pulses palpable bilaterally. Posterior tibial pulses palpable bilaterally. Capillary refill normal to all digits.  No cyanosis or clubbing noted. Pedal hair growth normal.  Neurologic Normal speech. Oriented to person, place, and time. Epicritic sensation to light touch grossly present bilaterally.  Dermatologic Nails well groomed and normal in appearance. No open wounds. HPK L submet 2/3 tender to palpation. HPK L heel but non-tender  Orthopedic: Normal joint ROM without pain or crepitus bilaterally. No visible deformities. POP L subment 2/3.   Radiographs: None today Assessment:   1. Pes planus of both feet   2. Metatarsalgia of left foot   3. Deformity of metatarsal bone of left foot   4. Porokeratosis    Plan:  Patient was evaluated and treated and all questions answered.  Pes Planus, Metatarsalgia, Porokeratosis -Lesion debrided with 312 blade and salinocaine  applied to soften lesion.  -Casted for [employer] today. Offload 2nd/3rd met left -Pads dispensed.  No follow-ups on file.

## 2018-02-14 ENCOUNTER — Other Ambulatory Visit: Payer: Medicare Other | Admitting: Orthotics

## 2018-02-14 ENCOUNTER — Ambulatory Visit (INDEPENDENT_AMBULATORY_CARE_PROVIDER_SITE_OTHER): Payer: Non-veteran care | Admitting: Family Medicine

## 2018-02-14 ENCOUNTER — Encounter (HOSPITAL_COMMUNITY): Payer: Self-pay | Admitting: *Deleted

## 2018-02-14 ENCOUNTER — Other Ambulatory Visit: Payer: Self-pay

## 2018-02-14 ENCOUNTER — Encounter: Payer: Self-pay | Admitting: Family Medicine

## 2018-02-14 ENCOUNTER — Inpatient Hospital Stay (HOSPITAL_COMMUNITY)
Admission: AD | Admit: 2018-02-14 | Discharge: 2018-02-17 | DRG: 807 | Disposition: A | Discharge status: Home / Self Care | Payer: No Typology Code available for payment source | Attending: Obstetrics & Gynecology | Admitting: Obstetrics & Gynecology

## 2018-02-14 VITALS — BP 104/66 | HR 80 | Wt 205.0 lb

## 2018-02-14 DIAGNOSIS — O34219 Maternal care for unspecified type scar from previous cesarean delivery: Secondary | ICD-10-CM | POA: Diagnosis present

## 2018-02-14 DIAGNOSIS — O09523 Supervision of elderly multigravida, third trimester: Secondary | ICD-10-CM

## 2018-02-14 DIAGNOSIS — W010XXA Fall on same level from slipping, tripping and stumbling without subsequent striking against object, initial encounter: Secondary | ICD-10-CM | POA: Diagnosis present

## 2018-02-14 DIAGNOSIS — O99824 Streptococcus B carrier state complicating childbirth: Secondary | ICD-10-CM | POA: Diagnosis present

## 2018-02-14 DIAGNOSIS — E669 Obesity, unspecified: Secondary | ICD-10-CM | POA: Diagnosis present

## 2018-02-14 DIAGNOSIS — O99214 Obesity complicating childbirth: Secondary | ICD-10-CM | POA: Diagnosis present

## 2018-02-14 DIAGNOSIS — Z3483 Encounter for supervision of other normal pregnancy, third trimester: Secondary | ICD-10-CM

## 2018-02-14 DIAGNOSIS — Z3A39 39 weeks gestation of pregnancy: Secondary | ICD-10-CM | POA: Diagnosis not present

## 2018-02-14 DIAGNOSIS — O99820 Streptococcus B carrier state complicating pregnancy: Secondary | ICD-10-CM

## 2018-02-14 HISTORY — DX: Anemia, unspecified: D64.9

## 2018-02-14 HISTORY — DX: Anxiety disorder, unspecified: F41.9

## 2018-02-14 HISTORY — DX: Major depressive disorder, single episode, unspecified: F32.9

## 2018-02-14 HISTORY — DX: Rheumatoid arthritis, unspecified: M06.9

## 2018-02-14 HISTORY — DX: Depression, unspecified: F32.A

## 2018-02-14 LAB — CBC
HCT: 34.3 % — ABNORMAL LOW (ref 36.0–46.0)
HEMOGLOBIN: 10.9 g/dL — AB (ref 12.0–15.0)
MCH: 26 pg (ref 26.0–34.0)
MCHC: 31.8 g/dL (ref 30.0–36.0)
MCV: 81.9 fL (ref 80.0–100.0)
Platelets: 169 10*3/uL (ref 150–400)
RBC: 4.19 MIL/uL (ref 3.87–5.11)
RDW: 15.1 % (ref 11.5–15.5)
WBC: 8.1 10*3/uL (ref 4.0–10.5)
nRBC: 0 % (ref 0.0–0.2)

## 2018-02-14 LAB — TYPE AND SCREEN
ABO/RH(D): B POS
ANTIBODY SCREEN: NEGATIVE

## 2018-02-14 MED ORDER — OXYTOCIN 40 UNITS IN LACTATED RINGERS INFUSION - SIMPLE MED
2.5000 [IU]/h | INTRAVENOUS | Status: DC
  Administered 2018-02-15: 2.5 [IU]/h via INTRAVENOUS
  Filled 2018-02-14: qty 1000

## 2018-02-14 MED ORDER — ACETAMINOPHEN 325 MG PO TABS: 650.0000 mg | ORAL_TABLET | ORAL | Status: DC | PRN

## 2018-02-14 MED ORDER — SODIUM CHLORIDE 0.9 % IV SOLN
5.0000 10*6.[IU] | Freq: Once | INTRAVENOUS | Status: DC
  Filled 2018-02-14: qty 5

## 2018-02-14 MED ORDER — SODIUM CHLORIDE 0.9 % IV SOLN
2.0000 g | Freq: Once | INTRAVENOUS | Status: AC
  Administered 2018-02-14: 2 g via INTRAVENOUS
  Filled 2018-02-14: qty 2

## 2018-02-14 MED ORDER — LACTATED RINGERS IV SOLN: 500.0000 mL | INTRAVENOUS | Status: DC | PRN

## 2018-02-14 MED ORDER — OXYTOCIN 40 UNITS IN LACTATED RINGERS INFUSION - SIMPLE MED
1.0000 m[IU]/min | INTRAVENOUS | Status: DC
  Administered 2018-02-14: 2 m[IU]/min via INTRAVENOUS

## 2018-02-14 MED ORDER — SOD CITRATE-CITRIC ACID 500-334 MG/5ML PO SOLN: 30.0000 mL | ORAL | Status: DC | PRN

## 2018-02-14 MED ORDER — ONDANSETRON HCL 4 MG/2ML IJ SOLN: 4.0000 mg | Freq: Four times a day (QID) | INTRAMUSCULAR | Status: DC | PRN

## 2018-02-14 MED ORDER — TERBUTALINE SULFATE 1 MG/ML IJ SOLN
0.2500 mg | Freq: Once | INTRAMUSCULAR | Status: DC | PRN
  Filled 2018-02-14: qty 1

## 2018-02-14 MED ORDER — OXYTOCIN BOLUS FROM INFUSION
500.0000 mL | Freq: Once | INTRAVENOUS | Status: AC
  Administered 2018-02-15: 500 mL via INTRAVENOUS

## 2018-02-14 MED ORDER — SODIUM CHLORIDE 0.9 % IV SOLN
2.0000 g | Freq: Four times a day (QID) | INTRAVENOUS | Status: DC
  Administered 2018-02-15: 2 g via INTRAVENOUS
  Filled 2018-02-14: qty 2000
  Filled 2018-02-14: qty 2
  Filled 2018-02-14: qty 2000

## 2018-02-14 MED ORDER — LACTATED RINGERS IV BOLUS
500.0000 mL | Freq: Once | INTRAVENOUS | Status: AC
  Administered 2018-02-14: 500 mL via INTRAVENOUS

## 2018-02-14 MED ORDER — OXYCODONE-ACETAMINOPHEN 5-325 MG PO TABS: 2.0000 | ORAL_TABLET | ORAL | Status: DC | PRN

## 2018-02-14 MED ORDER — OXYCODONE-ACETAMINOPHEN 5-325 MG PO TABS: 1.0000 | ORAL_TABLET | ORAL | Status: DC | PRN

## 2018-02-14 MED ORDER — LIDOCAINE HCL (PF) 1 % IJ SOLN
30.0000 mL | INTRAMUSCULAR | Status: DC | PRN
  Filled 2018-02-14: qty 30

## 2018-02-14 MED ORDER — LACTATED RINGERS IV SOLN
INTRAVENOUS | Status: DC
  Administered 2018-02-14 – 2018-02-15 (×2): via INTRAVENOUS

## 2018-02-14 MED ORDER — PENICILLIN G 3 MILLION UNITS IVPB - SIMPLE MED: 3.0000 10*6.[IU] | INTRAVENOUS | Status: DC

## 2018-02-14 NOTE — Anesthesia Pain Management Evaluation Note (Signed)
  [facility] Pain Management Visit Note  Patient: Brittany Vasquez, 37 y.o., female  "Hello I am a member of the anesthesia team at [facility]. We have an anesthesia team available at all times to provide care throughout the hospital, including epidural management and anesthesia for C-section. I don't know your plan for the delivery whether it a natural birth, water birth, IV sedation, nitrous supplementation, doula or epidural, but we want to meet your pain goals."   1.Was your pain managed to your expectations on prior hospitalizations?   Yes   2.What is your expectation for pain management during this hospitalization?     Labor support without medications and Epidural  3.How can we help you reach that goal? Possible epidural  Record the patient's initial score and the patient's pain goal.   Pain: 0  Pain Goal: 5 [facility] wants you to be able to say your pain was always managed very well.  Niurka Benecke 02/14/2018

## 2018-02-14 NOTE — MAU Provider Note (Signed)
History     CSN: [id number]  Arrival date and time: 02/14/18 1623   First Provider Initiated Contact with Patient 02/14/18 1705      Chief Complaint  Patient presents with  . Fall   HPI   Brittany Vasquez is a 37 y.o. female [phone] @ [redacted]w[redacted]d here in MAU after a fall that occurred at home today around 1500. She was trying to put shoes on her dogs feet and slipped on her wet patio and landed directly on her stomach. She denies bleeding, however is having some mild abdominal cramping. + FM at this time.   OB History    Gravida  4   Para  3   Term  3   Preterm  0   AB  0   Living  3     SAB  0   TAB  0   Ectopic  0   Multiple  0   Live Births  3           Past Medical History:  Diagnosis Date  . Anemia   . Anxiety   . Depression    doing ok  . Fibromyalgia   . Mass of perirectal soft tissue - prob seb cyst 05/22/2012  . Rheumatoid arthritis ([place])   . Ulcerative colitis [facility])     Past Surgical History:  Procedure Laterality Date  . CESAREAN SECTION  01/27/2012   Procedure: CESAREAN SECTION;  Surgeon: Thurnell Lose, MD;  Location: [facility];  Service: Obstetrics;  Laterality: N/A;  Primary Cesarean Section with birth of baby boy @ 43    Family History  Problem Relation Age of Onset  . Breast cancer Mother 96       breast cancer returned again at age 28  . Hypertension Mother   . Brain cancer Maternal Aunt 39  . Breast cancer Maternal Grandmother 80  . Stroke Paternal Grandfather     Social History   Tobacco Use  . Smoking status: Never Smoker  . Smokeless tobacco: Never Used  Substance Use Topics  . Alcohol use: No  . Drug use: No    Allergies: No Known Allergies  Medications Prior to Admission  Medication Sig Dispense Refill Last Dose  . calcium-vitamin D 250-100 MG-UNIT tablet Take 1 tablet by mouth 2 (two) times daily. 60 tablet 6 Taking  . flintstones complete (FLINTSTONES) 60 MG chewable tablet Chew 1 tablet by mouth daily.    Taking   Results for orders placed or performed during the hospital encounter of 02/14/18 (from the past 48 hour(s))  CBC     Status: Abnormal   Collection Time: 02/14/18  7:09 PM  Result Value Ref Range   WBC 8.1 4.0 - 10.5 K/uL   RBC 4.19 3.87 - 5.11 MIL/uL   Hemoglobin 10.9 (L) 12.0 - 15.0 g/dL   HCT 34.3 (L) 36.0 - 46.0 %   MCV 81.9 80.0 - 100.0 fL   MCH 26.0 26.0 - 34.0 pg   MCHC 31.8 30.0 - 36.0 g/dL   RDW 15.1 11.5 - 15.5 %   Platelets 169 150 - 400 K/uL   nRBC 0.0 0.0 - 0.2 %    Comment: Performed at [facility], [address], [place], [place] [address]  Type and screen [place]     Status: None   Collection Time: 02/14/18  7:09 PM  Result Value Ref Range   ABO/RH(D) B POS    Antibody Screen NEG  Sample Expiration      02/17/2018 Performed at [facility], [address] [place], [place] [address]     Review of Systems  Gastrointestinal: Positive for abdominal pain (+ cramping).  Genitourinary: Negative for vaginal bleeding.   Physical Exam   Blood pressure 105/62, pulse 83, temperature 98.6 F (37 C), temperature source Oral, resp. rate 17, last menstrual period 05/16/2017, unknown if currently breastfeeding.  Physical Exam  Constitutional: She is oriented to person, place, and time. She appears well-developed and well-nourished. No distress.  HENT:  Head: Normocephalic.  Musculoskeletal: Normal range of motion.  Neurological: She is alert and oriented to person, place, and time.  Skin: Skin is warm. She is not diaphoretic.  Psychiatric: Her behavior is normal.   Fetal Tracing: Baseline: Variability: Accelerations:  Decelerations: Toco: prolonged deceleration at 1656, HR down to 85 for >2 mins, back to baseline with minimal variability.   MAU Course  Procedures  None  MDM  Discussed fetal tracing with Dr. Nehemiah Settle, Dr. Nehemiah Settle to come down to MAU to discuss Admission with patient.   Assessment and Plan    A:  Fall  P:  Recommend admission for induction of labor @ [redacted]w[redacted]d  Anysha Frappier, Artist Pais, NP 02/14/2018 8:21 PM

## 2018-02-14 NOTE — Patient Instructions (Signed)

## 2018-02-14 NOTE — Progress Notes (Signed)
   PRENATAL VISIT NOTE  Subjective:  Brittany Vasquez is a 37 y.o. G4P3003 at [redacted]w[redacted]d being seen today for ongoing prenatal care.  She is currently monitored for the following issues for this low-risk pregnancy and has Previous cesarean delivery affecting pregnancy, antepartum; Supervision of normal pregnancy; AMA (advanced maternal age) multigravida 35+; Obesity in pregnancy, antepartum; and Group B Streptococcus carrier, +RV culture, currently pregnant on their problem list.  Patient reports no complaints.  Contractions: Regular. Vag. Bleeding: None.  Movement: Present. Denies leaking of fluid.   The following portions of the patient's history were reviewed and updated as appropriate: allergies, current medications, past family history, past medical history, past social history, past surgical history and problem list. Problem list updated.  Objective:   Vitals:   02/14/18 1108  BP: 104/66  Pulse: 80  Weight: 205 lb (93 kg)    Fetal Status: Fetal Heart Rate (bpm): 154 Fundal Height: 37 cm Movement: Present  Presentation: Vertex  General:  Alert, oriented and cooperative. Patient is in no acute distress.  Skin: Skin is warm and dry. No rash noted.   Cardiovascular: Normal heart rate noted  Respiratory: Normal respiratory effort, no problems with respiration noted  Abdomen: Soft, gravid, appropriate for gestational age.  Pain/Pressure: Present     Pelvic: Cervical exam deferred        Extremities: Normal range of motion.  Edema: None  Mental Status: Normal mood and affect. Normal behavior. Normal judgment and thought content.   Assessment and Plan:  Pregnancy: G4P3003 at [redacted]w[redacted]d  1. Encounter for supervision of other normal pregnancy in third trimester Continue routine prenatal care. IOL scheduled at 41+ wks, for patient preference   2. Group B Streptococcus carrier, +RV culture, currently pregnant Will need treatment in labor  3. Previous cesarean delivery affecting pregnancy,  antepartum For TOLAC, s/p VBAC x 1  4. Multigravida of advanced maternal age in third trimester Low risk NIPT  Term labor symptoms and general obstetric precautions including but not limited to vaginal bleeding, contractions, leaking of fluid and fetal movement were reviewed in detail with the patient. Please refer to After Visit Summary for other counseling recommendations.  Return in 1 week (on 02/21/2018) for OB visit and NST.  Future Appointments  Date Time Provider [place]  02/21/2018  9:30 AM Donnamae Jude, MD [facility]  03/01/2018  7:30 AM [facility] None  03/09/2018 11:00 AM Velora Heckler [facility]    Donnamae Jude, MD

## 2018-02-14 NOTE — MAU Note (Signed)
Around 3 was taking the dog out, slipped on the wet deck and fell on her stomach.  Some cramping, no bleeding or leaking.  +FM

## 2018-02-14 NOTE — Progress Notes (Signed)
LABOR PROGRESS NOTE  Brittany Vasquez is a 37 y.o. [phone] at [redacted]w[redacted]d  admitted for IOL for NRNST.   Subjective: Patient comfortable. Just ate some dinner. Was waiting for FOB to return to start induction.   Objective: BP 109/70   Pulse 67   Temp 98 F (36.7 C) (Oral)   Resp 16   Ht 5\' 7"  (1.702 m)   Wt 93 kg   LMP 05/16/2017   BMI 32.11 kg/m  or  Vitals:   02/14/18 1833 02/14/18 1924 02/14/18 2000 02/14/18 2046  BP:  114/67  109/70  Pulse:  68  67  Resp:  16  16  Temp:   98 F (36.7 C)   TempSrc:   Oral   Weight: 93 kg     Height: 5\' 7"  (1.702 m)       Dilation: Fingertip Effacement (%): Thick Cervical Position: Posterior Station: Ballotable Presentation: Vertex(by ultrasound) Exam by:: Juleen China, MD FHT: baseline rate 120, moderate varibility, +acel, no decel Toco: Occasional   Labs: Lab Results  Component Value Date   WBC 8.1 02/14/2018   HGB 10.9 (L) 02/14/2018   HCT 34.3 (L) 02/14/2018   MCV 81.9 02/14/2018   PLT 169 02/14/2018    Patient Active Problem List   Diagnosis Date Noted  . Indication for care in labor and delivery, antepartum 02/14/2018  . Group B Streptococcus carrier, +RV culture, currently pregnant 01/26/2018  . Supervision of normal pregnancy 07/19/2017  . AMA (advanced maternal age) multigravida 35+ 07/19/2017  . Obesity in pregnancy, antepartum 07/19/2017  . Previous cesarean delivery affecting pregnancy, antepartum 07/10/2016    Assessment / Plan: 37 y.o. G4P3003 at [redacted]w[redacted]d here for IOL for NRNST.   Labor: Induction. Patient is TOLAC. Will proceed with starting Pitocin 2x2 and plan to place FB when cervix more favorable.  Fetal Wellbeing:  Cat I  Pain Control:  Undecided. Considering nitrous oxide.  Anticipated MOD:  NSVD   Phill Myron, D.O. OB Fellow  02/14/2018, 10:13 PM

## 2018-02-14 NOTE — H&P (Addendum)
LABOR AND DELIVERY ADMISSION HISTORY AND PHYSICAL NOTE  Brittany Vasquez is a 37 y.o. female [phone] with IUP at [redacted]w[redacted]d by LMP presenting for IOL NRNST.  She reports positive fetal movement. She denies leakage of fluid or vaginal bleeding.  Seen in MAU after fall. Had a deceleration while in MAU, at that time it was decided to induce for NRST.  No contractions today. Denies HA, vision changes, RUQ pain, LE edema.   Prenatal History/Complications: PNC at [place] Pregnancy complications:  - h/o VBACx1  Sono: @[redacted]w[redacted]d , CWD, normal female anatomy, cephalic presentation, posterior placental lie, 739g, 43% EFW  Past Medical History: Past Medical History:  Diagnosis Date  . Anemia   . Anxiety   . Depression    doing ok  . Fibromyalgia   . Mass of perirectal soft tissue - prob seb cyst 05/22/2012  . Rheumatoid arthritis ([place])   . Ulcerative colitis [facility])     Past Surgical History: Past Surgical History:  Procedure Laterality Date  . CESAREAN SECTION  01/27/2012   Procedure: CESAREAN SECTION;  Surgeon: Thurnell Lose, MD;  Location: [facility];  Service: Obstetrics;  Laterality: N/A;  Primary Cesarean Section with birth of baby boy @ 1907    Obstetrical History: OB History    Gravida  4   Para  3   Term  3   Preterm  0   AB  0   Living  3     SAB  0   TAB  0   Ectopic  0   Multiple  0   Live Births  3           Social History: Social History   Socioeconomic History  . Marital status: Married    Spouse name: Not on file  . Number of children: 2  . Years of education: Not on file  . Highest education level: Not on file  Occupational History  . Not on file  Social Needs  . Financial resource strain: Not on file  . Food insecurity:    Worry: Not on file    Inability: Not on file  . Transportation needs:    Medical: Not on file    Non-medical: Not on file  Tobacco Use  . Smoking status: Never Smoker  . Smokeless tobacco: Never Used  Substance and  Sexual Activity  . Alcohol use: No  . Drug use: No  . Sexual activity: Yes    Birth control/protection: None  Lifestyle  . Physical activity:    Days per week: Not on file    Minutes per session: Not on file  . Stress: Not on file  Relationships  . Social connections:    Talks on phone: Not on file    Gets together: Not on file    Attends religious service: Not on file    Active member of club or organization: Not on file    Attends meetings of clubs or organizations: Not on file    Relationship status: Not on file  Other Topics Concern  . Not on file  Social History Narrative   Patient lives at home with her Fiance. Patient has a B.S. Degree and has 2 children. Patient denies any tobacco, alcohol, and illicit drug use.     Family History: Family History  Problem Relation Age of Onset  . Breast cancer Mother 71       breast cancer returned again at age 60  . Hypertension Mother   . Brain cancer  Maternal Aunt 39  . Breast cancer Maternal Grandmother 80  . Stroke Paternal Grandfather     Allergies: No Known Allergies  Medications Prior to Admission  Medication Sig Dispense Refill Last Dose  . calcium-vitamin D 250-100 MG-UNIT tablet Take 1 tablet by mouth 2 (two) times daily. 60 tablet 6 Taking  . flintstones complete (FLINTSTONES) 60 MG chewable tablet Chew 1 tablet by mouth daily.   Taking     Review of Systems  All systems reviewed and negative except as stated in HPI  Physical Exam Blood pressure 122/76, pulse 74, temperature 98.2 F (36.8 C), temperature source Oral, resp. rate 18, height 5\' 7"  (1.702 m), weight 93 kg, last menstrual period 05/16/2017, unknown if currently breastfeeding. General appearance: alert, oriented, NAD Lungs: normal respiratory effort Heart: regular rate Abdomen: soft, non-tender; gravid, FH appropriate for GA Extremities: No calf swelling or tenderness Presentation: will confirm cephalic prior to induction  Fetal monitoring:  baseline 165 (was elevated to 180 but has come down), moderate variability, +accel, -decel  Uterine activity: irregular  Dilation: Fingertip Effacement (%): Thick Station: Ballotable Exam by:: Dr. Tammi Klippel  Prenatal labs: ABO, Rh: B/Positive/-- (04/16 1140) Antibody: Negative (04/16 1140) Rubella: 1.50 (04/16 1140) RPR: Non Reactive (09/20 1220)  HBsAg: Negative (04/16 1140)  HIV: Non Reactive (09/20 1220)  GC/Chlamydia: Neg (10/22) GBS: Positive (10/22 1130)  2-hr GTT: 74/121/99 Genetic screening:  Low risk NIPS Anatomy US: normal female  Prenatal Transfer Tool  Maternal Diabetes: No Genetic Screening: Normal Maternal Ultrasounds/Referrals: Normal Fetal Ultrasounds or other Referrals:  None Maternal Substance Abuse:  No Significant Maternal Medications:  None Significant Maternal Lab Results: Lab values include: Group B Strep positive  No results found for this or any previous visit (from the past 24 hour(s)).  Patient Active Problem List   Diagnosis Date Noted  . Indication for care in labor and delivery, antepartum 02/14/2018  . Group B Streptococcus carrier, +RV culture, currently pregnant 01/26/2018  . Supervision of normal pregnancy 07/19/2017  . AMA (advanced maternal age) multigravida 35+ 07/19/2017  . Obesity in pregnancy, antepartum 07/19/2017  . Previous cesarean delivery affecting pregnancy, antepartum 07/10/2016    Assessment: Brittany Vasquez is a 37 y.o. [phone] at [redacted]w[redacted]d here for IOL for NRNST  #Labor: IOL for NRNST, will induce with Pitocin given h/o C/S #Pain: Per patient preference, considering nitrous  #FWB: Cat 1 #ID:  GBS pos - ampicillin per patient preference  #MOF: Breast #MOC:BTL #Circ:  N/A  Caroline More, DO PGY-2 02/14/2018, 7:08 PM  OB FELLOW HISTORY AND PHYSICAL ATTESTATION  I have seen and examined this patient; I agree with above documentation in the resident's note.   Phill Myron, D.O. OB Fellow  02/14/2018, 8:55  PM

## 2018-02-15 ENCOUNTER — Inpatient Hospital Stay (HOSPITAL_COMMUNITY): Payer: No Typology Code available for payment source | Admitting: Anesthesiology

## 2018-02-15 ENCOUNTER — Encounter (HOSPITAL_COMMUNITY): Payer: Self-pay

## 2018-02-15 DIAGNOSIS — O99824 Streptococcus B carrier state complicating childbirth: Secondary | ICD-10-CM

## 2018-02-15 DIAGNOSIS — Z3A39 39 weeks gestation of pregnancy: Secondary | ICD-10-CM

## 2018-02-15 LAB — CBC
HCT: 32 % — ABNORMAL LOW (ref 36.0–46.0)
HEMOGLOBIN: 10.4 g/dL — AB (ref 12.0–15.0)
MCH: 26.7 pg (ref 26.0–34.0)
MCHC: 32.5 g/dL (ref 30.0–36.0)
MCV: 82.1 fL (ref 80.0–100.0)
Platelets: 160 10*3/uL (ref 150–400)
RBC: 3.9 MIL/uL (ref 3.87–5.11)
RDW: 15.1 % (ref 11.5–15.5)
WBC: 9.8 10*3/uL (ref 4.0–10.5)
nRBC: 0 % (ref 0.0–0.2)

## 2018-02-15 LAB — RPR: RPR Ser Ql: NONREACTIVE

## 2018-02-15 MED ORDER — EPHEDRINE 5 MG/ML INJ
10.0000 mg | INTRAVENOUS | Status: DC | PRN
  Filled 2018-02-15: qty 2

## 2018-02-15 MED ORDER — PHENYLEPHRINE 40 MCG/ML (10ML) SYRINGE FOR IV PUSH (FOR BLOOD PRESSURE SUPPORT)
80.0000 ug | PREFILLED_SYRINGE | INTRAVENOUS | Status: DC | PRN
  Filled 2018-02-15: qty 5
  Filled 2018-02-15: qty 10

## 2018-02-15 MED ORDER — SIMETHICONE 80 MG PO CHEW: 80.0000 mg | CHEWABLE_TABLET | ORAL | Status: DC | PRN

## 2018-02-15 MED ORDER — OXYCODONE-ACETAMINOPHEN 5-325 MG PO TABS
1.0000 | ORAL_TABLET | ORAL | Status: DC | PRN
  Administered 2018-02-15 – 2018-02-17 (×4): 1 via ORAL
  Filled 2018-02-15 (×4): qty 1

## 2018-02-15 MED ORDER — WITCH HAZEL-GLYCERIN EX PADS: 1.0000 "application " | MEDICATED_PAD | CUTANEOUS | Status: DC | PRN

## 2018-02-15 MED ORDER — ONDANSETRON HCL 4 MG PO TABS: 4.0000 mg | ORAL_TABLET | ORAL | Status: DC | PRN

## 2018-02-15 MED ORDER — COCONUT OIL OIL
1.0000 "application " | TOPICAL_OIL | Status: DC | PRN
  Filled 2018-02-15: qty 120

## 2018-02-15 MED ORDER — FENTANYL 2.5 MCG/ML BUPIVACAINE 1/10 % EPIDURAL INFUSION (WH - ANES)
14.0000 mL/h | INTRAMUSCULAR | Status: DC | PRN
  Administered 2018-02-15: 14 mL/h via EPIDURAL
  Filled 2018-02-15: qty 100

## 2018-02-15 MED ORDER — PHENYLEPHRINE 40 MCG/ML (10ML) SYRINGE FOR IV PUSH (FOR BLOOD PRESSURE SUPPORT)
80.0000 ug | PREFILLED_SYRINGE | INTRAVENOUS | Status: DC | PRN
  Administered 2018-02-15: 80 ug via INTRAVENOUS
  Filled 2018-02-15: qty 5

## 2018-02-15 MED ORDER — OXYCODONE-ACETAMINOPHEN 5-325 MG PO TABS
2.0000 | ORAL_TABLET | ORAL | Status: DC | PRN
  Administered 2018-02-15 – 2018-02-17 (×5): 2 via ORAL
  Filled 2018-02-15 (×5): qty 2

## 2018-02-15 MED ORDER — BENZOCAINE-MENTHOL 20-0.5 % EX AERO
1.0000 "application " | INHALATION_SPRAY | CUTANEOUS | Status: DC | PRN
  Filled 2018-02-15: qty 56

## 2018-02-15 MED ORDER — IBUPROFEN 600 MG PO TABS
600.0000 mg | ORAL_TABLET | Freq: Four times a day (QID) | ORAL | Status: DC
  Administered 2018-02-15 – 2018-02-17 (×9): 600 mg via ORAL
  Filled 2018-02-15 (×9): qty 1

## 2018-02-15 MED ORDER — PRENATAL MULTIVITAMIN CH
1.0000 | ORAL_TABLET | Freq: Every day | ORAL | Status: DC
  Administered 2018-02-16 – 2018-02-17 (×2): 1 via ORAL
  Filled 2018-02-15 (×2): qty 1

## 2018-02-15 MED ORDER — ZOLPIDEM TARTRATE 5 MG PO TABS: 5.0000 mg | ORAL_TABLET | Freq: Every evening | ORAL | Status: DC | PRN

## 2018-02-15 MED ORDER — SENNOSIDES-DOCUSATE SODIUM 8.6-50 MG PO TABS
2.0000 | ORAL_TABLET | ORAL | Status: DC
  Administered 2018-02-15 – 2018-02-16 (×2): 2 via ORAL
  Filled 2018-02-15 (×2): qty 2

## 2018-02-15 MED ORDER — ACETAMINOPHEN 325 MG PO TABS
650.0000 mg | ORAL_TABLET | ORAL | Status: DC | PRN
  Administered 2018-02-15: 650 mg via ORAL
  Filled 2018-02-15 (×3): qty 2

## 2018-02-15 MED ORDER — DIPHENHYDRAMINE HCL 25 MG PO CAPS: 25.0000 mg | ORAL_CAPSULE | Freq: Four times a day (QID) | ORAL | Status: DC | PRN

## 2018-02-15 MED ORDER — LACTATED RINGERS IV SOLN
500.0000 mL | Freq: Once | INTRAVENOUS | Status: AC
  Administered 2018-02-15: 500 mL via INTRAVENOUS

## 2018-02-15 MED ORDER — ONDANSETRON HCL 4 MG/2ML IJ SOLN: 4.0000 mg | INTRAMUSCULAR | Status: DC | PRN

## 2018-02-15 MED ORDER — DIBUCAINE 1 % RE OINT: 1.0000 "application " | TOPICAL_OINTMENT | RECTAL | Status: DC | PRN

## 2018-02-15 MED ORDER — DIPHENHYDRAMINE HCL 50 MG/ML IJ SOLN: 12.5000 mg | INTRAMUSCULAR | Status: DC | PRN

## 2018-02-15 MED ORDER — TETANUS-DIPHTH-ACELL PERTUSSIS 5-2.5-18.5 LF-MCG/0.5 IM SUSP: 0.5000 mL | Freq: Once | INTRAMUSCULAR | Status: DC

## 2018-02-15 MED ORDER — LIDOCAINE HCL (PF) 1 % IJ SOLN
INTRAMUSCULAR | Status: DC | PRN
  Administered 2018-02-15: 8 mL via EPIDURAL

## 2018-02-15 NOTE — Addendum Note (Signed)
Addendum  created 02/15/18 1017 by Hewitt Blade, CRNA   Charge Capture section accepted, Sign clinical note

## 2018-02-15 NOTE — Anesthesia Postprocedure Evaluation (Signed)
Anesthesia Post Note  Patient: Brittany Vasquez  Procedure(s) Performed: AN AD HOC LABOR EPIDURAL     Patient location during evaluation: Mother Baby Anesthesia Type: Epidural Level of consciousness: awake and alert Pain management: pain level controlled Vital Signs Assessment: post-procedure vital signs reviewed and stable Respiratory status: spontaneous breathing, nonlabored ventilation and respiratory function stable Cardiovascular status: stable Postop Assessment: no headache, no backache and epidural receding Anesthetic complications: no    Last Vitals:  Vitals:   02/15/18 0505 02/15/18 0608  BP: (!) 107/56 95/68  Pulse: 61 (!) 57  Resp: 18 18  Temp: 36.8 C 36.7 C  SpO2: 98% 97%    Last Pain:  Vitals:   02/15/18 0610  TempSrc:   PainSc: 3    Pain Goal:                 Beryle Zeitz

## 2018-02-15 NOTE — Anesthesia Procedure Notes (Signed)
Epidural Patient location during procedure: OB Start time: 02/15/2018 1:50 AM End time: 02/15/2018 1:55 AM  Staffing Anesthesiologist: Janeece Riggers, MD  Preanesthetic Checklist Completed: patient identified, site marked, surgical consent, pre-op evaluation, timeout performed, IV checked, risks and benefits discussed and monitors and equipment checked  Epidural Patient position: sitting Prep: site prepped and draped and DuraPrep Patient monitoring: continuous pulse ox and blood pressure Approach: midline Location: L4-L5 Injection technique: LOR air  Needle:  Needle type: Tuohy  Needle gauge: 17 G Needle length: 9 cm and 9 Needle insertion depth: 7 cm Catheter type: closed end flexible Catheter size: 19 Gauge Catheter at skin depth: 12 cm Test dose: negative  Assessment Events: blood not aspirated, injection not painful, no injection resistance, negative IV test and no paresthesia

## 2018-02-15 NOTE — Anesthesia Postprocedure Evaluation (Signed)
Anesthesia Post Note  Patient: Brittany Vasquez  Procedure(s) Performed: AN AD HOC LABOR EPIDURAL     Patient location during evaluation: Mother Baby Anesthesia Type: Epidural Level of consciousness: awake and alert Pain management: pain level controlled Vital Signs Assessment: post-procedure vital signs reviewed and stable Respiratory status: spontaneous breathing, nonlabored ventilation and respiratory function stable Cardiovascular status: stable Postop Assessment: no headache, no backache, epidural receding, no apparent nausea or vomiting, patient able to bend at knees, adequate PO intake and able to ambulate Anesthetic complications: no    Last Vitals:  Vitals:   02/15/18 0608 02/15/18 0913  BP: 95/68 117/68  Pulse: (!) 57 65  Resp: 18 18  Temp: 36.7 C 36.8 C  SpO2: 97% 98%    Last Pain:  Vitals:   02/15/18 0941  TempSrc:   PainSc: 6    Pain Goal:                 Jabier Mutton

## 2018-02-15 NOTE — Anesthesia Preprocedure Evaluation (Signed)
Anesthesia Evaluation  Patient identified by MRN, date of birth, ID band Patient awake    Reviewed: Allergy & Precautions, H&P , NPO status , Patient's Chart, lab work & pertinent test results, reviewed documented beta blocker date and time   Airway Mallampati: I  TM Distance: >3 FB Neck ROM: full    Dental no notable dental hx.    Pulmonary neg pulmonary ROS,    Pulmonary exam normal breath sounds clear to auscultation       Cardiovascular negative cardio ROS Normal cardiovascular exam Rhythm:regular Rate:Normal     Neuro/Psych negative neurological ROS  negative psych ROS   GI/Hepatic negative GI ROS, Neg liver ROS,   Endo/Other  negative endocrine ROS  Renal/GU negative Renal ROS  negative genitourinary   Musculoskeletal   Abdominal   Peds  Hematology negative hematology ROS (+)   Anesthesia Other Findings   Reproductive/Obstetrics (+) Pregnancy                             Anesthesia Physical Anesthesia Plan  ASA: II  Anesthesia Plan: Epidural   Post-op Pain Management:    Induction:   PONV Risk Score and Plan:   Airway Management Planned:   Additional Equipment:   Intra-op Plan:   Post-operative Plan:   Informed Consent: I have reviewed the patients History and Physical, chart, labs and discussed the procedure including the risks, benefits and alternatives for the proposed anesthesia with the patient or authorized representative who has indicated his/her understanding and acceptance.   Dental Advisory Given  Plan Discussed with:   Anesthesia Plan Comments: (Labs checked- platelets confirmed with RN in room. Fetal heart tracing, per RN, reported to be stable enough for sitting procedure. Discussed epidural, and patient consents to the procedure:  included risk of possible headache,backache, failed block, allergic reaction, and nerve injury. This patient was asked if  she had any questions or concerns before the procedure started.)        Anesthesia Quick Evaluation

## 2018-02-15 NOTE — Lactation Note (Signed)
This note was copied from a baby's chart. Lactation Consultation Note  Patient Name: Girl Ryllie Nieland [id number] Date: 02/15/2018 Reason for consult: Initial assessment;Term  P4 mother whose infant is now 69 hours old.  Mother breast fed her other three children who are now 86 months, 70 years and 37 years old  Mother was getting ready to breast feed when I arrived.  Baby was not showing feeding cues but it had been 3 hours since the last feeding.  I offered to stay and assist/observe latching and mother accepted.  Baby was sleepy and did not awaken.  Mother attempted to latch but baby showed no feeding cues and was not interested.  I reassured mother that this is typical behavior for her age.  Encouraged STS and watching for feeding cues.  Mother was familiar with feeding cues and hand expression.  Colostrum container provided for any EBM she obtains with hand expression.  Milk storage times reviewed.    Mother will feed 8-12 times/24 hours or sooner if baby shows cues.  She will continue hand expression before/after feedings.  Mom made aware of O/P services, breastfeeding support groups, community resources, and our phone # for post-discharge questions. Father present. Mother will be a "stay at home" mother and has a DEBP for personal use.    Maternal Data Formula Feeding for Exclusion: No Has patient been taught Hand Expression?: Yes Does the patient have breastfeeding experience prior to this delivery?: Yes  Feeding Feeding Type: Breast Fed  LATCH Score Latch: Too sleepy or reluctant, no latch achieved, no sucking elicited.  Audible Swallowing: None  Type of Nipple: Everted at rest and after stimulation  Comfort (Breast/Nipple): Soft / non-tender  Hold (Positioning): Assistance needed to correctly position infant at breast and maintain latch.  LATCH Score: 5  Interventions Interventions: Breast feeding basics reviewed;Assisted with latch;Skin to skin;Breast massage;Hand  express;Position options;Support pillows;Adjust position;Breast compression  Lactation Tools Discussed/Used WIC Program: No   Consult Status Consult Status: Follow-up Date: 02/16/18 Follow-up type: In-patient    Zachry Hopfensperger R Rosario Kushner 02/15/2018, 2:21 PM

## 2018-02-16 MED ORDER — IBUPROFEN 600 MG PO TABS: 600.0000 mg | ORAL_TABLET | Freq: Four times a day (QID) | ORAL | 0 refills | Status: DC | PRN

## 2018-02-16 NOTE — Progress Notes (Signed)
Notified Dr. Ilda Basset of mother's request to not be discharged early today. See clinical social worker's previous note regarding bonding with baby and edinburgh score. Dr. Ilda Basset cancelled discharge order for today. Maxwell Caul, Leretha Dykes [place]

## 2018-02-16 NOTE — Progress Notes (Signed)
POSTPARTUM PROGRESS NOTE  Post Partum Day 1  Subjective:  Brittany Vasquez is a 37 y.o. [id number] s/p VBAC at [redacted]w[redacted]d.  She reports she is doing well. No acute events overnight. She denies any problems with ambulating, voiding or po intake. Denies SOB, chest pain, near syncope, nausea or vomiting.  Pain is moderately controlled with percocet. Patient complaining of 4/10 lower abdominal pain and 5/10 back pain around epidural site.  Lochia is improving. Patient without bowel movement, but is passing flatus.   Objective: Blood pressure 93/63, pulse (!) 59, temperature 97.8 F (36.6 C), temperature source Oral, resp. rate 18, height 5\' 7"  (1.702 m), weight 93 kg, last menstrual period 05/16/2017, SpO2 98 %, unknown if currently breastfeeding.  Physical Exam:  General: alert, cooperative and no distress Chest: no respiratory distress, CTAB Heart:regular rate, distal pulses intact Abdomen: soft, nontender to palpation Uterine Fundus: firm, appropriately tender DVT Evaluation: No calf swelling or tenderness Extremities: No edema MSK: Mild tenderness over epidural site. No erythema, swelling, or bruising appreciated Skin: warm, dry  Recent Labs    02/14/18 1909 02/15/18 0622  HGB 10.9* 10.4*  HCT 34.3* 32.0*    Assessment/Plan: Brittany ALGUIRE is a 37 y.o. [id number] s/p VBAC at [redacted]w[redacted]d   PPD#1 - Doing well  Routine postpartum care  GBS+: treated with ampicillin, remains afebrile  Vitals stable  Continue percocet for pain, encouraged ambulation as tolerated Contraception: nexplanon Feeding: breast Dispo: Plan for discharge today. Patient would like to go home today as she has other children to attend to.    LOS: 2 days   Ferrel Logan, PA-S 02/16/2018

## 2018-02-16 NOTE — Progress Notes (Signed)
CSW received consult due to score 13 on Edinburgh Depression Screen.  CSW met with MOB in room 145 to offer support and complete assessment.    When CSW arrived, MOB was bonding with infant as evidence by engaging in skin to skin.  FOB was also present and was observing MOB's and infant's interactions. MOB gave CSW permission to complete the assessment while FOB was present.  MOB was polite, forthcoming, and receptive to meeting with CSW.   CSW asked about MOB's MH hx and MOB reported a hx of depression and shared that MOB receives outpatient counseling with the [facility] in [place], [place]. MOB denied a medication regiment expressed outpatient counseling has been beneficial to managing MOB's mood. CSW offered MOB other resources for parenting and support and MOB declined.  MOB reported having minium supported and shared that FOB is the only person she can depend on.  MOB reported having all essential items needed for infant and feeling prepare to parent. However, disclosed not feeling bonded with infant.  CSW normalized MOB's thoughts and educated MOB about how each child is different and how sometime bonding with a newborn is not immediate; MOB agreed.  CSW suggested interventions to increase bonding and MOB was receptive.  MOB communicated that MOB asked for an early discharged but know feels like it's in the best interest of MOB and infant for them to delay discharge until tomorrow.  FOB agreed with MOB and agreed to manage the other three children at home while MOB continues to be inpatient (CSW updated bedside nurse).   CSW provided education regarding the baby blues period vs. perinatal mood disorders, discussed treatment and gave resources for mental health follow up if concerns arise.  CSW recommends self-evaluation during the postpartum time period using the New Mom Checklist from Postpartum Progress and encouraged MOB to contact a medical professional if symptoms are noted at any time. MOB  presented with insight and awareness and did not demonstrate in acute MH symptoms. CSW assessed for safety and MOB denied SI, HI, and DV.   CSW provided review of Sudden Infant Death Syndrome (SIDS) precautions.    CSW identifies no further need for intervention and no barriers to discharge at this time.  Laurey Arrow, MSW, LCSW Clinical Social Work [phone]

## 2018-02-16 NOTE — Progress Notes (Signed)
MOB was referred for history of depression/anxiety. * Referral screened out by Clinical Social Worker because none of the following criteria appear to apply: ~ History of anxiety/depression during this pregnancy, or of post-partum depression following prior delivery; No concerns noted in OB records.  ~ Diagnosis of anxiety and/or depression within last 3 years OR * MOB's symptoms currently being treated with medication and/or therapy.  Please contact the Clinical Social Worker if needs arise, by [facility] request, or if MOB scores greater than 9/yes to question 10 on Edinburgh Postpartum Depression Screen.  Laurey Arrow, MSW, LCSW Clinical Social Work [phone]

## 2018-02-16 NOTE — Discharge Instructions (Signed)
Vaginal Delivery, Care After °Refer to this sheet in the next few weeks. These instructions provide you with information about caring for yourself after vaginal delivery. Your health care provider may also give you more specific instructions. Your treatment has been planned according to current medical practices, but problems sometimes occur. Call your health care provider if you have any problems or questions. °What can I expect after the procedure? °After vaginal delivery, it is common to have: °· Some bleeding from your vagina. °· Soreness in your abdomen, your vagina, and the area of skin between your vaginal opening and your anus (perineum). °· Pelvic cramps. °· Fatigue. ° °Follow these instructions at home: °Medicines °· Take over-the-counter and prescription medicines only as told by your health care provider. °· If you were prescribed an antibiotic medicine, take it as told by your health care provider. Do not stop taking the antibiotic until it is finished. °Driving ° °· Do not drive or operate heavy machinery while taking prescription pain medicine. °· Do not drive for 24 hours if you received a sedative. °Lifestyle °· Do not drink alcohol. This is especially important if you are breastfeeding or taking medicine to relieve pain. °· Do not use tobacco products, including cigarettes, chewing tobacco, or e-cigarettes. If you need help quitting, ask your health care provider. °Eating and drinking °· Drink at least 8 eight-ounce glasses of water every day unless you are told not to by your health care provider. If you choose to breastfeed your baby, you may need to drink more water than this. °· Eat high-fiber foods every day. These foods may help prevent or relieve constipation. High-fiber foods include: °? Whole grain cereals and breads. °? Brown rice. °? Beans. °? Fresh fruits and vegetables. °Activity °· Return to your normal activities as told by your health care provider. Ask your health care provider  what activities are safe for you. °· Rest as much as possible. Try to rest or take a nap when your baby is sleeping. °· Do not lift anything that is heavier than your baby or 10 lb (4.5 kg) until your health care provider says that it is safe. °· Talk with your health care provider about when you can engage in sexual activity. This may depend on your: °? Risk of infection. °? Rate of healing. °? Comfort and desire to engage in sexual activity. °Vaginal Care °· If you have an episiotomy or a vaginal tear, check the area every day for signs of infection. Check for: °? More redness, swelling, or pain. °? More fluid or blood. °? Warmth. °? Pus or a bad smell. °· Do not use tampons or douches until your health care provider says this is safe. °· Watch for any blood clots that may pass from your vagina. These may look like clumps of dark red, brown, or black discharge. °General instructions °· Keep your perineum clean and dry as told by your health care provider. °· Wear loose, comfortable clothing. °· Wipe from front to back when you use the toilet. °· Ask your health care provider if you can shower or take a bath. If you had an episiotomy or a perineal tear during labor and delivery, your health care provider may tell you not to take baths for a certain length of time. °· Wear a bra that supports your breasts and fits you well. °· If possible, have someone help you with household activities and help care for your baby for at least a few days after   you leave the hospital. °· Keep all follow-up visits for you and your baby as told by your health care provider. This is important. °Contact a health care provider if: °· You have: °? Vaginal discharge that has a bad smell. °? Difficulty urinating. °? Pain when urinating. °? A sudden increase or decrease in the frequency of your bowel movements. °? More redness, swelling, or pain around your episiotomy or vaginal tear. °? More fluid or blood coming from your episiotomy or  vaginal tear. °? Pus or a bad smell coming from your episiotomy or vaginal tear. °? A fever. °? A rash. °? Little or no interest in activities you used to enjoy. °? Questions about caring for yourself or your baby. °· Your episiotomy or vaginal tear feels warm to the touch. °· Your episiotomy or vaginal tear is separating or does not appear to be healing. °· Your breasts are painful, hard, or turn red. °· You feel unusually sad or worried. °· You feel nauseous or you vomit. °· You pass large blood clots from your vagina. If you pass a blood clot from your vagina, save it to show to your health care provider. Do not flush blood clots down the toilet without having your health care provider look at them. °· You urinate more than usual. °· You are dizzy or light-headed. °· You have not breastfed at all and you have not had a menstrual period for 12 weeks after delivery. °· You have stopped breastfeeding and you have not had a menstrual period for 12 weeks after you stopped breastfeeding. °Get help right away if: °· You have: °? Pain that does not go away or does not get better with medicine. °? Chest pain. °? Difficulty breathing. °? Blurred vision or spots in your vision. °? Thoughts about hurting yourself or your baby. °· You develop pain in your abdomen or in one of your legs. °· You develop a severe headache. °· You faint. °· You bleed from your vagina so much that you fill two sanitary pads in one hour. °This information is not intended to replace advice given to you by your health care provider. Make sure you discuss any questions you have with your health care provider. °Document Released: 03/19/2000 Document Revised: 09/03/2015 Document Reviewed: 04/06/2015 °Elsevier Interactive Patient Education © 2018 [employer] ° °

## 2018-02-17 NOTE — Discharge Summary (Signed)
OB Discharge Summary     Patient Name: Brittany Vasquez DOB: 10/07/1980 MRN: [id number]  Date of admission: 02/14/2018 Delivering MD: Truett Mainland   Date of discharge: 02/17/2018  Admitting diagnosis: 28 WKS, NST, FELL ON STOMACH Intrauterine pregnancy: [redacted]w[redacted]d     Secondary diagnosis:  Active Problems:   Indication for care in labor and delivery, antepartum  Additional problems: Successful VBAC x2      Discharge diagnosis: Term Pregnancy Delivered and VBAC                                                                                                Post partum procedures:none   Augmentation: Pitocin  Complications: None  Hospital course:  Induction of Labor With Vaginal Delivery   37 y.o. yo [id number] at [redacted]w[redacted]d was admitted to the hospital 02/14/2018 for induction of labor.  Indication for induction: NRNST.  Patient had an uncomplicated labor course as follows: Membrane Rupture Time/Date: 2:52 AM ,02/15/2018   Intrapartum Procedures: Episiotomy: None [1]                                         Lacerations:  None [1]  Patient had delivery of a Viable infant.  Information for the patient's newborn:  Thurma, Priego [[id number]]  Delivery Method: Vag-Spont   02/15/2018  Details of delivery can be found in separate delivery note.  Patient had a routine postpartum course. Patient is discharged home 02/17/18.  Physical exam  Vitals:   02/15/18 1702 02/15/18 2141 02/16/18 0607 02/17/18 0600  BP: 110/63 96/61 93/63  (!) 98/54  Pulse: 63 63 (!) 59 (!) 56  Resp: 18 18 18 16   Temp: 98.2 F (36.8 C) 97.6 F (36.4 C) 97.8 F (36.6 C)   TempSrc: Axillary Oral Oral   SpO2:   98%   Weight:      Height:       General: alert, cooperative and no distress Lochia: appropriate Uterine Fundus: firm Incision: N/A DVT Evaluation: No evidence of DVT seen on physical exam. No significant calf/ankle edema. Labs: Lab Results  Component Value Date   WBC 9.8 02/15/2018   HGB 10.4 (L) 02/15/2018   HCT 32.0 (L) 02/15/2018   MCV 82.1 02/15/2018   PLT 160 02/15/2018   CMP Latest Ref Rng & Units 11/28/2015  Glucose 65 - 99 mg/dL 130(H)  BUN 6 - 20 mg/dL 11  Creatinine 0.44 - 1.00 mg/dL 0.70  Sodium 135 - 145 mmol/L 135  Potassium 3.5 - 5.1 mmol/L 4.2  Chloride 101 - 111 mmol/L 105  CO2 22 - 32 mmol/L 25  Calcium 8.9 - 10.3 mg/dL 9.2  Total Protein 6.5 - 8.1 g/dL 7.2  Total Bilirubin 0.3 - 1.2 mg/dL 0.3  Alkaline Phos 38 - 126 U/L 49  AST 15 - 41 U/L 17  ALT 14 - 54 U/L 10(L)    Discharge instruction: per After Visit Summary and "Baby and Me Booklet".  After visit meds:  Allergies as of 02/17/2018  No Known Allergies     Medication List    STOP taking these medications   calcium carbonate 500 MG chewable tablet Commonly known as:  TUMS - dosed in mg elemental calcium     TAKE these medications   calcium-vitamin D 250-100 MG-UNIT tablet Take 1 tablet by mouth 2 (two) times daily.   flintstones complete 60 MG chewable tablet Chew 2 tablets by mouth daily.   ibuprofen 600 MG tablet Commonly known as:  ADVIL,MOTRIN Take 1 tablet (600 mg total) by mouth every 6 (six) hours as needed.       Diet: routine diet  Activity: Advance as tolerated. Pelvic rest for 6 weeks.   Outpatient follow up:4 weeks Follow up Appt: Future Appointments  Date Time Provider [facility]  02/21/2018  4:30 PM Donnamae Jude, MD [facility]  03/09/2018 11:00 AM Puckett, [place] [facility]   Follow up Visit:No follow-ups on file.  Postpartum contraception: Nexplanon  Newborn Data: Live born female  Birth Weight: 6 lb 5.6 oz (2880 g) APGAR: 8, 9  Newborn Delivery   Birth date/time:  02/15/2018 02:59:00 Delivery type:  VBAC, Spontaneous     Baby Feeding: Breast Disposition:home with mother   02/17/2018 Patriciaann Clan, DO

## 2018-02-17 NOTE — Lactation Note (Signed)
This note was copied from a baby's chart. Lactation Consultation Note  Patient Name: Brittany Vasquez [id number] Date: 02/17/2018 Reason for consult: Follow-up assessment;Term Randel Books is currently on breast in cradle hold.  Latch is good and mom comfortable with feeding.  Breasts soft.  Instructed to continue to feed with cues.  Lactation outpatient services and support reviewed and encouraged prn.  Maternal Data    Feeding    LATCH Score                   Interventions    Lactation Tools Discussed/Used     Consult Status Consult Status: Complete Follow-up type: Call as needed    Ave Filter 02/17/2018, 9:37 AM

## 2018-02-17 NOTE — Discharge Summary (Signed)
Obstetrics Discharge Summary [facility]N [facility]   Patient Name: Brittany Vasquez DOB: 1980/04/18 MRN: [id number]  Date of admission: 02/14/2018 Delivering MD: Truett Mainland   Date of discharge: 02/17/2018  Admitting diagnosis: 31 WKS, NST, FELL ON STOMACH Intrauterine pregnancy: [redacted]w[redacted]d     Secondary diagnosis:   Active Problems:   Indication for care in labor and delivery, antepartum  Additional problems:  . History of prior Cesarean section . AMA . Obesity      Discharge diagnosis: VBAC                                            Postpartum procedures: None  Complications: none  Hospital course: Brittany Vasquez is a 38 y.o. [redacted]w[redacted]d who was admitted for IOL for NRNST after fall onto stomach when walking dogs. Her pregnancy was complicated by history of C-section, obesity. Her labor course was notable for induction with pitocin. Delivery was uncomplicated. Please see delivery/op note for additional details. Her postpartum course was uncomplicated. She was breastfeeding without difficulty. By day of discharge, she was passing flatus, urinating, eating and drinking without difficulty. Her pain was well-controlled, and she was discharged home with ibuprofen. She will follow-up in clinic in 4 weeks.   Physical exam  Vitals:   02/15/18 1702 02/15/18 2141 02/16/18 0607 02/17/18 0600  BP: 110/63 96/61 93/63  (!) 98/54  Pulse: 63 63 (!) 59 (!) 56  Resp: 18 18 18 16   Temp: 98.2 F (36.8 C) 97.6 F (36.4 C) 97.8 F (36.6 C)   TempSrc: Axillary Oral Oral   SpO2:   98%   Weight:      Height:       General: well-appearing Lochia: appropriate Uterine Fundus: firm Incision: N/A DVT Evaluation: No significant calf/ankle edema. Labs: Lab Results  Component Value Date   WBC 9.8 02/15/2018   HGB 10.4 (L) 02/15/2018   HCT 32.0 (L) 02/15/2018   MCV 82.1 02/15/2018   PLT 160 02/15/2018   CMP Latest Ref Rng & Units 11/28/2015  Glucose 65 - 99 mg/dL 130(H)  BUN 6 - 20 mg/dL  11  Creatinine 0.44 - 1.00 mg/dL 0.70  Sodium 135 - 145 mmol/L 135  Potassium 3.5 - 5.1 mmol/L 4.2  Chloride 101 - 111 mmol/L 105  CO2 22 - 32 mmol/L 25  Calcium 8.9 - 10.3 mg/dL 9.2  Total Protein 6.5 - 8.1 g/dL 7.2  Total Bilirubin 0.3 - 1.2 mg/dL 0.3  Alkaline Phos 38 - 126 U/L 49  AST 15 - 41 U/L 17  ALT 14 - 54 U/L 10(L)    Discharge instructions: Per After Visit Summary and "Baby and Me Booklet"  After visit meds:  Allergies as of 02/17/2018   No Known Allergies     Medication List    STOP taking these medications   calcium carbonate 500 MG chewable tablet Commonly known as:  TUMS - dosed in mg elemental calcium     TAKE these medications   calcium-vitamin D 250-100 MG-UNIT tablet Take 1 tablet by mouth 2 (two) times daily.   flintstones complete 60 MG chewable tablet Chew 2 tablets by mouth daily.   ibuprofen 600 MG tablet Commonly known as:  ADVIL,MOTRIN Take 1 tablet (600 mg total) by mouth every 6 (six) hours as needed.       Postpartum contraception: undecided  Diet: Routine Diet Activity: Advance  as tolerated. Pelvic rest for 6 weeks.   Follow-up Appt: Future Appointments  Date Time Provider [place]  02/21/2018  4:30 PM Donnamae Jude, MD [facility]  03/09/2018 11:00 AM Windy Carina, [place] [facility]   Newborn Data: Live born female  Birth Weight: 6 lb 5.6 oz (2880 g) APGAR: 8, 9  Newborn Delivery   Birth date/time:  02/15/2018 02:59:00 Delivery type:  VBAC, Spontaneous    Baby Feeding: Breast Disposition:home with mother  Lambert Mody. Juleen China, DO OB/GYN Fellow, Faculty Practice

## 2018-02-20 ENCOUNTER — Inpatient Hospital Stay (HOSPITAL_COMMUNITY): Admit: 2018-02-20 | Payer: Self-pay

## 2018-02-21 ENCOUNTER — Ambulatory Visit (INDEPENDENT_AMBULATORY_CARE_PROVIDER_SITE_OTHER): Payer: Non-veteran care | Admitting: Family Medicine

## 2018-02-21 ENCOUNTER — Encounter: Payer: Self-pay | Admitting: Family Medicine

## 2018-02-21 ENCOUNTER — Encounter: Payer: Non-veteran care | Admitting: Family Medicine

## 2018-02-21 DIAGNOSIS — F329 Major depressive disorder, single episode, unspecified: Secondary | ICD-10-CM | POA: Insufficient documentation

## 2018-02-21 DIAGNOSIS — F32A Depression, unspecified: Secondary | ICD-10-CM | POA: Insufficient documentation

## 2018-02-21 DIAGNOSIS — F331 Major depressive disorder, recurrent, moderate: Secondary | ICD-10-CM

## 2018-02-21 NOTE — Progress Notes (Signed)
   Subjective:    Patient ID: Brittany Vasquez is a 37 y.o. female presenting with Postpartum Care  on 02/21/2018  HPI: Here today for postpartum mood check. Prenatal course complicated by unexpected IOL due to decels. She is struggling due to not having much help at home. Has 27 month old and new baby. She is nursing. She has a Social worker. Husband is going back to work Monday. She is feeling overwhelmed. She does not have family here to help.  Review of Systems  Constitutional: Negative for chills and fever.  Respiratory: Negative for shortness of breath.   Cardiovascular: Negative for chest pain.  Gastrointestinal: Negative for abdominal pain, nausea and vomiting.  Genitourinary: Negative for dysuria.  Skin: Negative for rash.      Objective:    BP 121/81   Pulse (!) 55   Wt 201 lb (91.2 kg)   BMI 31.48 kg/m  Physical Exam  Constitutional: She is oriented to person, place, and time. She appears well-developed and well-nourished. No distress.  HENT:  Head: Normocephalic and atraumatic.  Eyes: No scleral icterus.  Neck: Neck supple.  Cardiovascular: Normal rate.  Pulmonary/Chest: Effort normal.  Abdominal: Soft.  Neurological: She is alert and oriented to person, place, and time.  Skin: Skin is warm and dry.  Psychiatric: Her behavior is normal. Thought content normal. Her affect is blunt. She exhibits a depressed mood.  tearful        Assessment & Plan:   Problem List Items Addressed This Visit      Unprioritized   Depression    Declines medication. Contracts for safety. Will see counselor. Considering 72 hour new medication, when/if available.         Total face-to-face time with patient: 25 minutes. Over 50% of encounter was spent on counseling and coordination of care. Return in about 3 weeks (around 03/14/2018) for pp check.  Donnamae Jude 02/21/2018 4:46 PM

## 2018-02-21 NOTE — Patient Instructions (Signed)
Postpartum Depression and Baby Blues The postpartum period begins right after the birth of a baby. During this time, there is often a great amount of joy and excitement. It is also a time of many changes in the life of the parents. Regardless of how many times a mother gives birth, each child brings new challenges and dynamics to the family. It is not unusual to have feelings of excitement along with confusing shifts in moods, emotions, and thoughts. All mothers are at risk of developing postpartum depression or the "baby blues." These mood changes can occur right after giving birth, or they may occur many months after giving birth. The baby blues or postpartum depression can be mild or severe. Additionally, postpartum depression can go away rather quickly, or it can be a long-term condition. What are the causes? Raised hormone levels and the rapid drop in those levels are thought to be a main cause of postpartum depression and the baby blues. A number of hormones change during and after pregnancy. Estrogen and progesterone usually decrease right after the delivery of your baby. The levels of thyroid hormone and various cortisol steroids also rapidly drop. Other factors that play a role in these mood changes include major life events and genetics. What increases the risk? If you have any of the following risks for the baby blues or postpartum depression, know what symptoms to watch out for during the postpartum period. Risk factors that may increase the likelihood of getting the baby blues or postpartum depression include:  Having a personal or family history of depression.  Having depression while being pregnant.  Having premenstrual mood issues or mood issues related to oral contraceptives.  Having a lot of life stress.  Having marital conflict.  Lacking a social support network.  Having a baby with special needs.  Having health problems, such as diabetes.  What are the signs or  symptoms? Symptoms of baby blues include:  Brief changes in mood, such as going from extreme happiness to sadness.  Decreased concentration.  Difficulty sleeping.  Crying spells, tearfulness.  Irritability.  Anxiety.  Symptoms of postpartum depression typically begin within the first month after giving birth. These symptoms include:  Difficulty sleeping or excessive sleepiness.  Marked weight loss.  Agitation.  Feelings of worthlessness.  Lack of interest in activity or food.  Postpartum psychosis is a very serious condition and can be dangerous. Fortunately, it is rare. Displaying any of the following symptoms is cause for immediate medical attention. Symptoms of postpartum psychosis include:  Hallucinations and delusions.  Bizarre or disorganized behavior.  Confusion or disorientation.  How is this diagnosed? A diagnosis is made by an evaluation of your symptoms. There are no medical or lab tests that lead to a diagnosis, but there are various questionnaires that a health care provider may use to identify those with the baby blues, postpartum depression, or psychosis. Often, a screening tool called the [place] Postnatal Depression Scale is used to diagnose depression in the postpartum period. How is this treated? The baby blues usually goes away on its own in 1-2 weeks. Social support is often all that is needed. You will be encouraged to get adequate sleep and rest. Occasionally, you may be given medicines to help you sleep. Postpartum depression requires treatment because it can last several months or longer if it is not treated. Treatment may include individual or group therapy, medicine, or both to address any social, physiological, and psychological factors that may play a role in the  depression. Regular exercise, a healthy diet, rest, and social support may also be strongly recommended. Postpartum psychosis is more serious and needs treatment right away.  Hospitalization is often needed. Follow these instructions at home:  Get as much rest as you can. Nap when the baby sleeps.  Exercise regularly. Some women find yoga and walking to be beneficial.  Eat a balanced and nourishing diet.  Do little things that you enjoy. Have a cup of tea, take a bubble bath, read your favorite magazine, or listen to your favorite music.  Avoid alcohol.  Ask for help with household chores, cooking, grocery shopping, or running errands as needed. Do not try to do everything.  Talk to people close to you about how you are feeling. Get support from your partner, family members, friends, or other new moms.  Try to stay positive in how you think. Think about the things you are grateful for.  Do not spend a lot of time alone.  Only take over-the-counter or prescription medicine as directed by your health care provider.  Keep all your postpartum appointments.  Let your health care provider know if you have any concerns. Contact a health care provider if: You are having a reaction to or problems with your medicine. Get help right away if:  You have suicidal feelings.  You think you may harm the baby or someone else. This information is not intended to replace advice given to you by your health care provider. Make sure you discuss any questions you have with your health care provider. Document Released: 12/25/2003 Document Revised: 08/28/2015 Document Reviewed: 01/01/2013 Elsevier Interactive Patient Education  2017 [employer].

## 2018-02-21 NOTE — Assessment & Plan Note (Signed)
Declines medication. Contracts for safety. Will see counselor. Considering 72 hour new medication, when/if available.

## 2018-02-27 ENCOUNTER — Telehealth: Payer: Self-pay

## 2018-02-27 NOTE — Telephone Encounter (Signed)
Received message from Brittany Vasquez nurse concerning patient home visit today. Nurse Bethena Roys reports patient scored a 107 of depression screening. Patient reports she feels safe and her family feels safe. Patient previous notes reports she has a Social worker.  I will reach out to her tomorrow to check on her.

## 2018-03-01 ENCOUNTER — Inpatient Hospital Stay (HOSPITAL_COMMUNITY): Payer: Non-veteran care

## 2018-03-09 ENCOUNTER — Ambulatory Visit: Payer: Non-veteran care | Admitting: Orthotics

## 2018-03-09 DIAGNOSIS — M7742 Metatarsalgia, left foot: Secondary | ICD-10-CM

## 2018-03-09 DIAGNOSIS — M2141 Flat foot [pes planus] (acquired), right foot: Secondary | ICD-10-CM

## 2018-03-09 DIAGNOSIS — Q828 Other specified congenital malformations of skin: Secondary | ICD-10-CM

## 2018-03-09 DIAGNOSIS — M2142 Flat foot [pes planus] (acquired), left foot: Principal | ICD-10-CM

## 2018-03-09 DIAGNOSIS — M21962 Unspecified acquired deformity of left lower leg: Secondary | ICD-10-CM

## 2018-03-09 NOTE — Progress Notes (Signed)
Patient came in today to pick up custom made foot orthotics.  The goals were accomplished and the patient reported no dissatisfaction with said orthotics.  Patient was advised of breakin period and how to report any issues. 

## 2018-03-13 ENCOUNTER — Telehealth: Payer: Self-pay | Admitting: *Deleted

## 2018-03-13 NOTE — Progress Notes (Signed)
Post Partum Exam  Brittany Vasquez is a 37 y.o. G73P4004 female who presents for a postpartum visit. She is 4 weeks postpartum following a spontaneous vaginal delivery. I have fully reviewed the prenatal and intrapartum course. The delivery was at 39.2 gestational weeks.  Anesthesia: epidural. Postpartum course has been uncomplicated. Baby's course has been uncomplicated. Baby is feeding by breast. Bleeding staining only. Bowel function is normal. Bladder function is normal. Patient is not sexually active. Contraception method is Nexplanon. Postpartum depression screening: 11-she is depressed. She would like to potentially due the IV medicine for treatment of pp depression--[facility] [place]. We will have this likely in Jan. Or Feb. I have personally reviewed and verified the above information.  The following portions of the patient's history were reviewed and updated as appropriate: allergies, current medications, past family history, past medical history, past social history, past surgical history and problem list. Last pap smear done 03/10/2017 and was normal  Review of Systems Pertinent items noted in HPI and remainder of comprehensive ROS otherwise negative.    Objective:  Blood pressure 132/86, pulse 75, height 5\' 7"  (1.702 m), weight 194 lb (88 kg), unknown if currently breastfeeding.  General:  alert, cooperative and appears stated age  Lungs: normal effort  Heart:  regular rate and rhythm        Assessment:    Nml postpartum exam. Pap smear not done at today's visit.   Plan:   1. Contraception: Nexplanon to get at the [facility] 2. Annual exams to be done at the [facility] 3. Follow up in: 2 months if interested in [employer] or as needed.

## 2018-03-13 NOTE — Telephone Encounter (Signed)
Received message from baby love RN from Friday. Called Almyra Free RN and she wanted to inform us she saw her on Friday and she scored a 9 on her depression screening and her BP was sightly elevated and has had more frequent headaches. Pt denied any chest pain or visual disturbances. Pt did have an appoint with her counselor but they had to cancel her appointment. Pt is also having positional dizziness episodes. Pt has appointment 12/10 with Dr Kennon Rounds, will call ger today to follow up to check on patient.   Crosby Oyster, RN

## 2018-03-13 NOTE — Telephone Encounter (Signed)
Called patient to follow up, Pt sates she is feeling the same and that she will be in tomorrow to see Dr Kennon Rounds. Instructed that if she starts to feel worse she can reach out to [place] and we can see her today.

## 2018-03-14 ENCOUNTER — Ambulatory Visit (INDEPENDENT_AMBULATORY_CARE_PROVIDER_SITE_OTHER): Payer: Non-veteran care | Admitting: Family Medicine

## 2018-03-14 ENCOUNTER — Encounter: Payer: Self-pay | Admitting: Family Medicine

## 2018-03-14 DIAGNOSIS — F331 Major depressive disorder, recurrent, moderate: Secondary | ICD-10-CM

## 2018-03-14 NOTE — Patient Instructions (Signed)
Postpartum Depression and Baby Blues The postpartum period begins right after the birth of a baby. During this time, there is often a great amount of joy and excitement. It is also a time of many changes in the life of the parents. Regardless of how many times a mother gives birth, each child brings new challenges and dynamics to the family. It is not unusual to have feelings of excitement along with confusing shifts in moods, emotions, and thoughts. All mothers are at risk of developing postpartum depression or the "baby blues." These mood changes can occur right after giving birth, or they may occur many months after giving birth. The baby blues or postpartum depression can be mild or severe. Additionally, postpartum depression can go away rather quickly, or it can be a long-term condition. What are the causes? Raised hormone levels and the rapid drop in those levels are thought to be a main cause of postpartum depression and the baby blues. A number of hormones change during and after pregnancy. Estrogen and progesterone usually decrease right after the delivery of your baby. The levels of thyroid hormone and various cortisol steroids also rapidly drop. Other factors that play a role in these mood changes include major life events and genetics. What increases the risk? If you have any of the following risks for the baby blues or postpartum depression, know what symptoms to watch out for during the postpartum period. Risk factors that may increase the likelihood of getting the baby blues or postpartum depression include:  Having a personal or family history of depression.  Having depression while being pregnant.  Having premenstrual mood issues or mood issues related to oral contraceptives.  Having a lot of life stress.  Having marital conflict.  Lacking a social support network.  Having a baby with special needs.  Having health problems, such as diabetes.  What are the signs or  symptoms? Symptoms of baby blues include:  Brief changes in mood, such as going from extreme happiness to sadness.  Decreased concentration.  Difficulty sleeping.  Crying spells, tearfulness.  Irritability.  Anxiety.  Symptoms of postpartum depression typically begin within the first month after giving birth. These symptoms include:  Difficulty sleeping or excessive sleepiness.  Marked weight loss.  Agitation.  Feelings of worthlessness.  Lack of interest in activity or food.  Postpartum psychosis is a very serious condition and can be dangerous. Fortunately, it is rare. Displaying any of the following symptoms is cause for immediate medical attention. Symptoms of postpartum psychosis include:  Hallucinations and delusions.  Bizarre or disorganized behavior.  Confusion or disorientation.  How is this diagnosed? A diagnosis is made by an evaluation of your symptoms. There are no medical or lab tests that lead to a diagnosis, but there are various questionnaires that a health care provider may use to identify those with the baby blues, postpartum depression, or psychosis. Often, a screening tool called the [place] Postnatal Depression Scale is used to diagnose depression in the postpartum period. How is this treated? The baby blues usually goes away on its own in 1-2 weeks. Social support is often all that is needed. You will be encouraged to get adequate sleep and rest. Occasionally, you may be given medicines to help you sleep. Postpartum depression requires treatment because it can last several months or longer if it is not treated. Treatment may include individual or group therapy, medicine, or both to address any social, physiological, and psychological factors that may play a role in the  depression. Regular exercise, a healthy diet, rest, and social support may also be strongly recommended. Postpartum psychosis is more serious and needs treatment right away.  Hospitalization is often needed. Follow these instructions at home:  Get as much rest as you can. Nap when the baby sleeps.  Exercise regularly. Some women find yoga and walking to be beneficial.  Eat a balanced and nourishing diet.  Do little things that you enjoy. Have a cup of tea, take a bubble bath, read your favorite magazine, or listen to your favorite music.  Avoid alcohol.  Ask for help with household chores, cooking, grocery shopping, or running errands as needed. Do not try to do everything.  Talk to people close to you about how you are feeling. Get support from your partner, family members, friends, or other new moms.  Try to stay positive in how you think. Think about the things you are grateful for.  Do not spend a lot of time alone.  Only take over-the-counter or prescription medicine as directed by your health care provider.  Keep all your postpartum appointments.  Let your health care provider know if you have any concerns. Contact a health care provider if: You are having a reaction to or problems with your medicine. Get help right away if:  You have suicidal feelings.  You think you may harm the baby or someone else. This information is not intended to replace advice given to you by your health care provider. Make sure you discuss any questions you have with your health care provider. Document Released: 12/25/2003 Document Revised: 08/28/2015 Document Reviewed: 01/01/2013 Elsevier Interactive Patient Education  2017 [employer].

## 2018-03-14 NOTE — Assessment & Plan Note (Signed)
Considering IV treatment--will look into options.

## 2018-03-21 ENCOUNTER — Telehealth: Payer: Self-pay

## 2018-03-21 ENCOUNTER — Other Ambulatory Visit: Payer: Self-pay

## 2018-03-21 DIAGNOSIS — F331 Major depressive disorder, recurrent, moderate: Secondary | ICD-10-CM

## 2018-03-21 MED ORDER — SERTRALINE HCL 50 MG PO TABS: 50.0000 mg | ORAL_TABLET | Freq: Every day | ORAL | 1 refills | Status: DC

## 2018-03-21 NOTE — Telephone Encounter (Signed)
Received a message from the on-call nurse. Patient is requesting medication be called in for depression. She would like it to be sent to the [facility] in [place].

## 2018-03-21 NOTE — Telephone Encounter (Signed)
Patient does not want zoloft. She would like to try Zulresso. I think you all discuss this in the office but the on call nurse did not get the message correct. She would like this called into the [facility] in [place]. I have told her this may take some time to get approved.

## 2018-03-22 NOTE — Telephone Encounter (Signed)
I have spoken with patient and she stated she does not know how to go about getting another provider at this time.At this time she does not want to take the Zoloft at this time.

## 2018-05-16 IMAGING — US US MFM OB DETAIL+14 WK
1 series · 13 of 28 positions shown · non-contrast
Comparison: none

[Series 1: us mfm ob detail+14 wk · 13 of 86 slices shown]
[im 4/86]
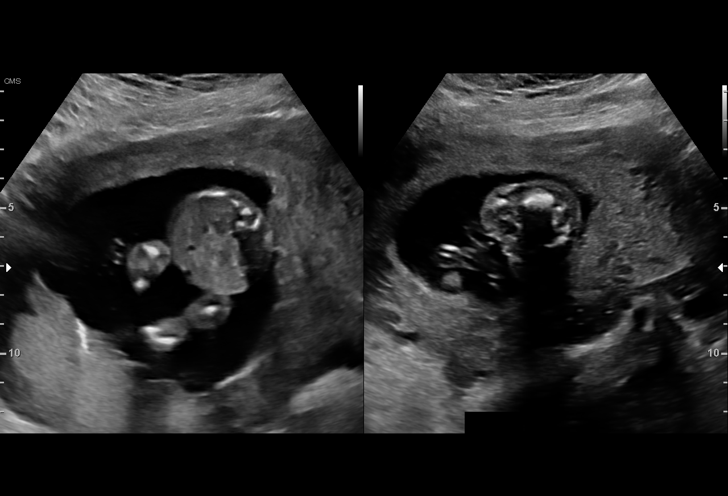
[im 10/86]
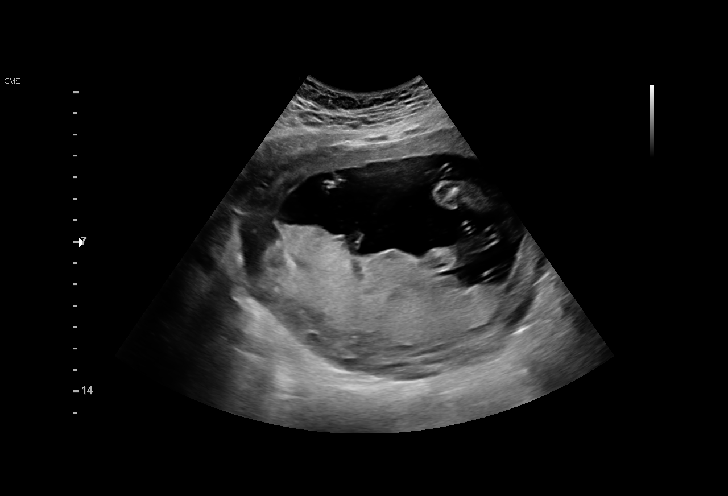
[im 16/86]
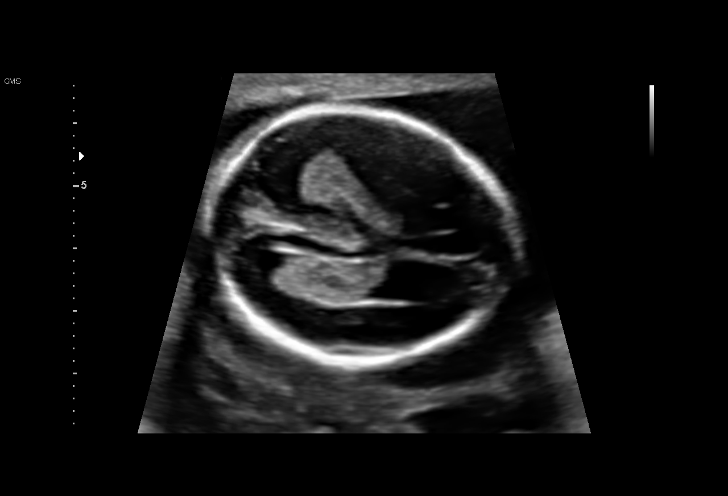
[im 23/86]
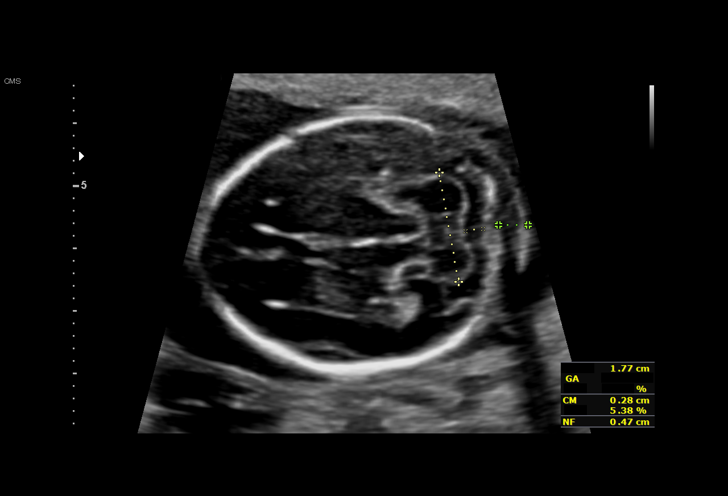
[im 29/86]
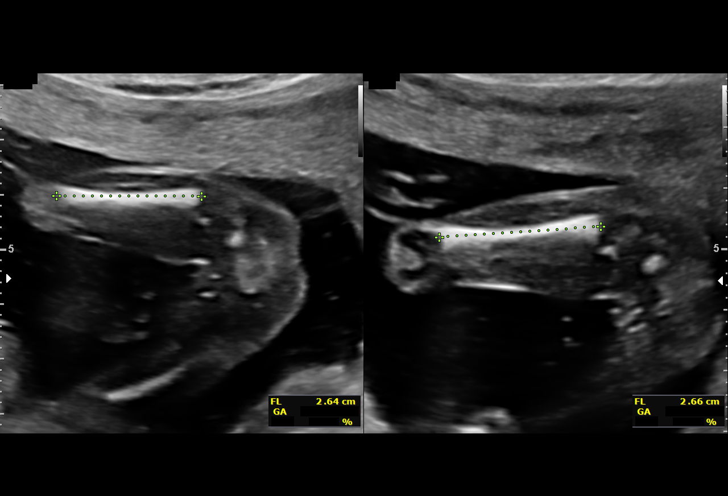
[im 35/86]
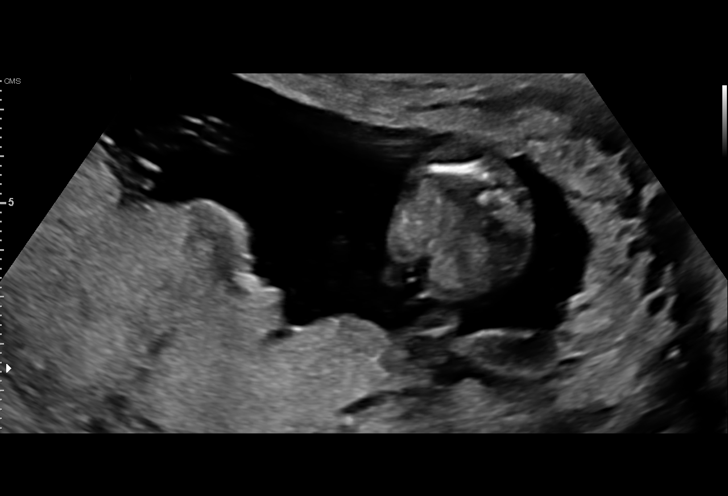
[im 45/86]
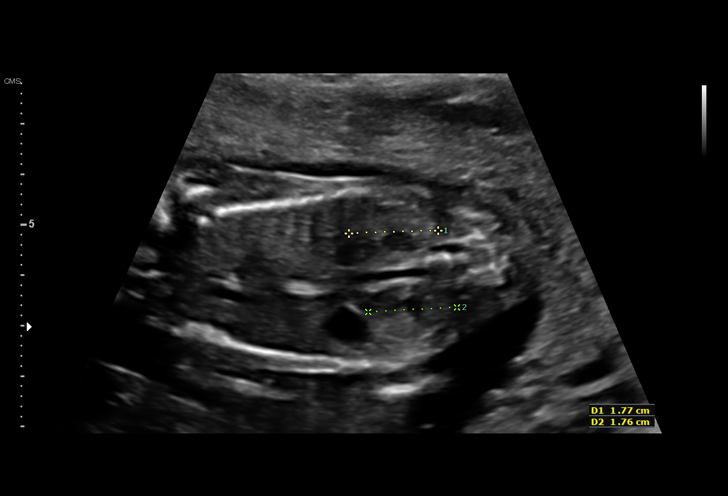
[im 51/86]
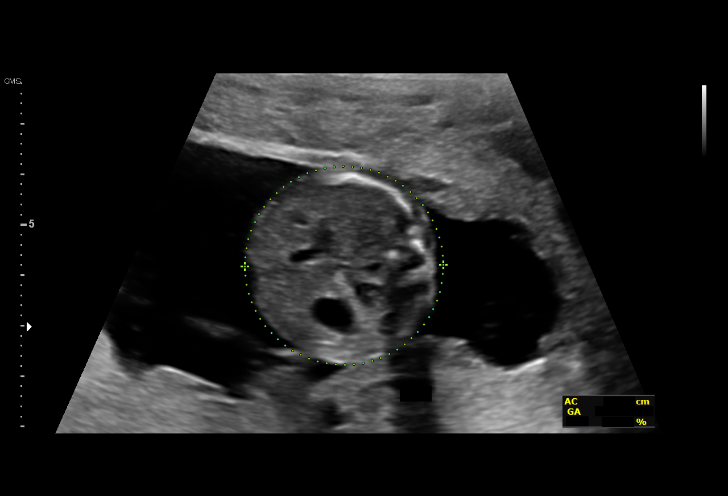
[im 57/86]
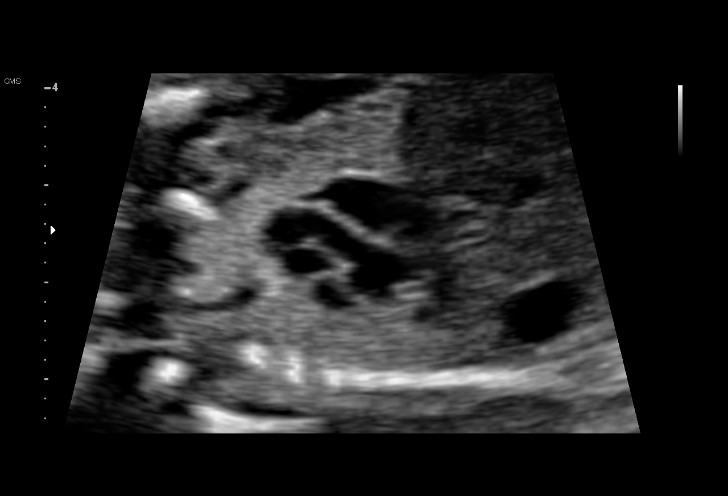
[im 63/86]
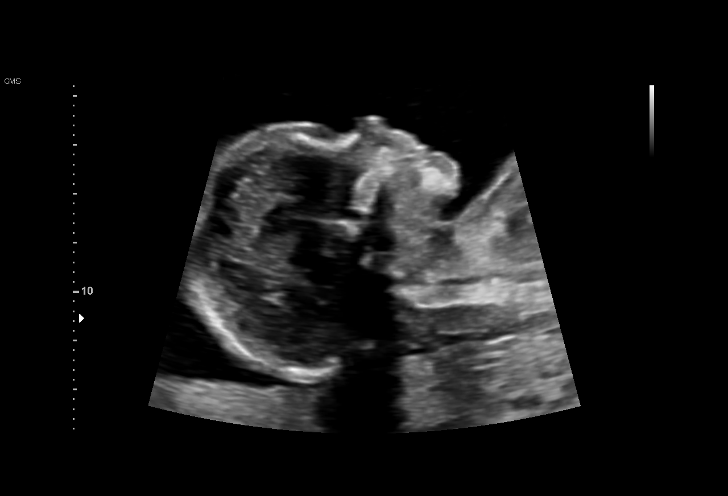
[im 70/86]
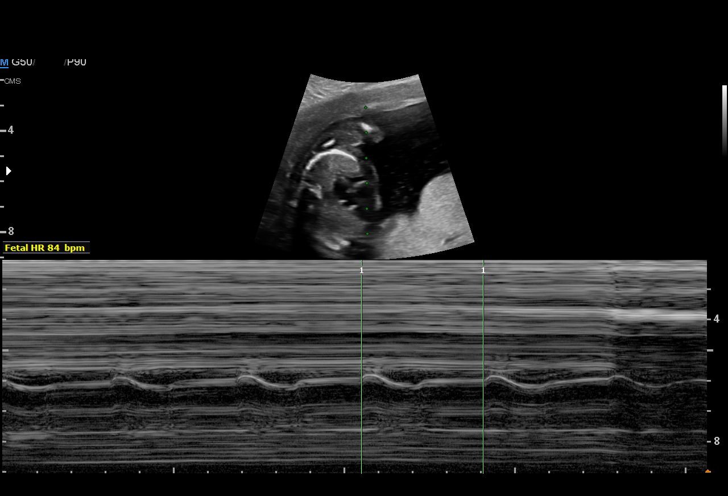
[im 76/86]
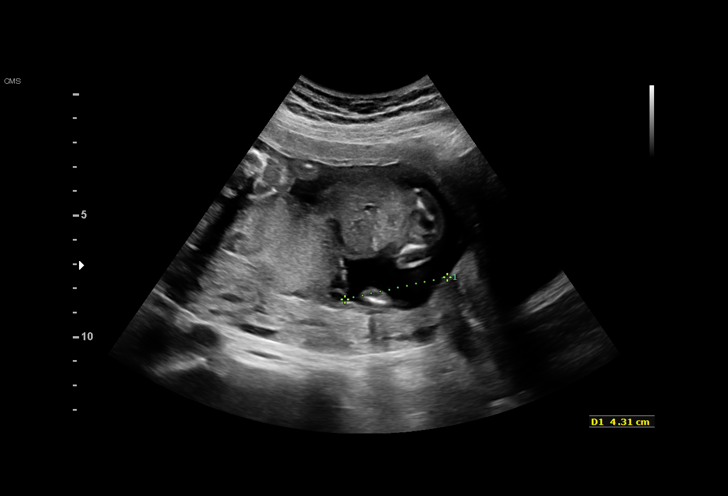
[im 82/86]
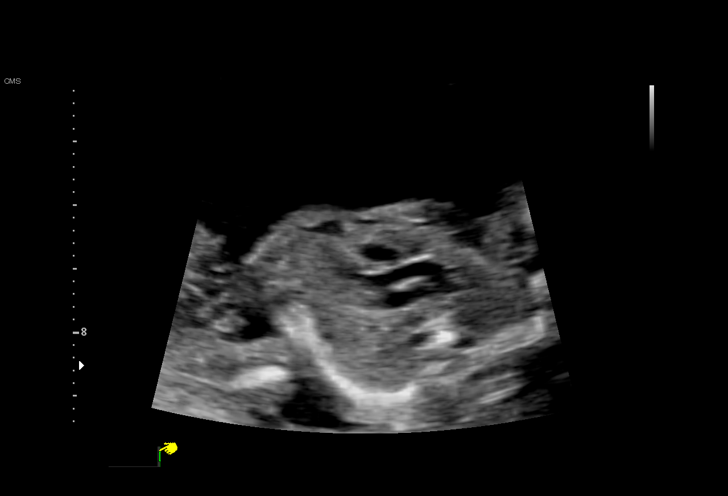

[13 of 28 positions shown; findings below may reference images not displayed]

Indications

18 weeks gestation of pregnancy
Advanced maternal age multigravida 35+,
second trimester
History of cesarean delivery, currently
pregnant
Fetal abnormality - other known or
suspected (i.e. choriod plexus cyst, EIF)
Encounter for antenatal screening for
malformations
OB History

Blood Type:            Height:  5'7"   Weight (lb):  182      BMI:
Gravidity:    3         Term:   2
Living:       2
Fetal Evaluation

Num Of Fetuses:     1
Fetal Heart         122
Rate(bpm):
Cardiac Activity:   Observed
Presentation:       Breech
Placenta:           Posterior, above cervical os
P. Cord Insertion:  Visualized, central

Amniotic Fluid
AFI FV:      Subjectively within normal limits

Largest Pocket(cm)
4.7
Biometry

BPD:        40  mm     G. Age:  18w 1d         38  %    CI:        71.69   %   70 - 86
FL/HC:      17.6   %   15.8 - 18
HC:      150.4  mm     G. Age:  18w 1d         27  %    HC/AC:      1.22       1.07 -
AC:      122.8  mm     G. Age:  17w 6d         31  %    FL/BPD:     66.3   %
FL:       26.5  mm     G. Age:  18w 0d         31  %    FL/AC:      21.6   %   20 - 24
HUM:      26.6  mm     G. Age:  18w 3d         54  %
CER:      17.7  mm     G. Age:  17w 5d         29  %
NFT:       4.7  mm
CM:        2.8  mm

Est. FW:     220  gm      0 lb 8 oz     38  %
Gestational Age

LMP:           18w 3d       Date:   10/11/15                 EDD:   07/17/16
U/S Today:     18w 0d                                        EDD:   07/20/16
Best:          18w 3d    Det. By:   LMP  (10/11/15)          EDD:   07/17/16
Anatomy

Cranium:               Appears normal         Aortic Arch:            Appears normal
Cavum:                 Appears normal         Ductal Arch:            Appears normal
Ventricles:            Appears normal         Diaphragm:              Appears normal
Choroid Plexus:        Right choroid          Stomach:                Appears normal, left
plexus cyst
sided
Cerebellum:            Appears normal         Abdomen:                Appears normal
Posterior Fossa:       Appears normal         Abdominal Wall:         Appears nml (cord
insert, abd wall)
Nuchal Fold:           Appears normal         Cord Vessels:           Appears normal (3
vessel cord)
Face:                  Appears normal         Kidneys:                Appear normal
(orbits and profile)
Lips:                  Appears normal         Bladder:                Appears normal
Thoracic:              Appears normal         Spine:                  Appears normal
Heart:                 Echogenic focus        Upper Extremities:      Appears normal
in LV
RVOT:                  Not well visualized    Lower Extremities:      Appears normal
LVOT:                  Appears normal

Other:  Parents do not wish to know sex of fetus at this time. Heels and 5th
digit visualized. Nasal bone visualized. Technically difficult due to
fetal position.
Cervix Uterus Adnexa

Cervix
Length:            3.2  cm.
Normal appearance by transabdominal scan.

Uterus
No abnormality visualized.

Left Ovary
Within normal limits.

Right Ovary
Not visualized.
Cul De Sac:   No free fluid seen.
Adnexa:       No abnormality visualized.
Comments

An echogenic focus was seen in the left cardiac ventricle.
However, no gross fetal anomalies or other soft markers of
aneuploidy were identified.   An echogenic intracardiac focus
is felt to represent a calcified papillary muscle, and is not
associated with structural or functional cardiac abnormalities.
Although an echogenic cardiac focus may be associated with
an increased risk of Down syndrome, this risk is felt to be
minimal, especially when it is seen as an isolated finding.

A Right choroid plexus cysts was noted.  This is a common
variant (1-2%) of all pregnancies, and carries a small
association with Trisomy 18.  However, there were no other
ultrasound stigmata of Trisomy 18, making the possibility of
Trisomy 18 less likely.
Impression

Single IUP at 18w 3d
Advanced maternal age - normal first trimester screen
An echogenic intracardiac focus was noted
Right choroid plexus cyst
Open hands were visualized
Limited views of the RVOT were obtained
The remainder of the fetal anatomy appears normal
Posterior placenta without previa
Normal amniotic fluid volume
Recommendations

The patient was offered NIPS - declined
Recommend follow up ultrasound in 4 weeks for growth and
to reevaluate the fetal heart

## 2018-06-05 DIAGNOSIS — F331 Major depressive disorder, recurrent, moderate: Secondary | ICD-10-CM | POA: Diagnosis not present

## 2018-06-07 DIAGNOSIS — F331 Major depressive disorder, recurrent, moderate: Secondary | ICD-10-CM | POA: Diagnosis not present

## 2018-06-14 DIAGNOSIS — F331 Major depressive disorder, recurrent, moderate: Secondary | ICD-10-CM | POA: Diagnosis not present

## 2018-06-21 DIAGNOSIS — F331 Major depressive disorder, recurrent, moderate: Secondary | ICD-10-CM | POA: Diagnosis not present

## 2018-07-19 DIAGNOSIS — F331 Major depressive disorder, recurrent, moderate: Secondary | ICD-10-CM | POA: Diagnosis not present

## 2018-07-26 DIAGNOSIS — F331 Major depressive disorder, recurrent, moderate: Secondary | ICD-10-CM | POA: Diagnosis not present

## 2018-08-03 DIAGNOSIS — F331 Major depressive disorder, recurrent, moderate: Secondary | ICD-10-CM | POA: Diagnosis not present

## 2018-08-10 DIAGNOSIS — F331 Major depressive disorder, recurrent, moderate: Secondary | ICD-10-CM | POA: Diagnosis not present

## 2018-08-16 DIAGNOSIS — F331 Major depressive disorder, recurrent, moderate: Secondary | ICD-10-CM | POA: Diagnosis not present

## 2018-09-20 DIAGNOSIS — F331 Major depressive disorder, recurrent, moderate: Secondary | ICD-10-CM | POA: Diagnosis not present

## 2018-09-22 ENCOUNTER — Ambulatory Visit (INDEPENDENT_AMBULATORY_CARE_PROVIDER_SITE_OTHER): Payer: Medicare Other | Admitting: Podiatry

## 2018-09-22 ENCOUNTER — Other Ambulatory Visit: Payer: Self-pay

## 2018-09-22 ENCOUNTER — Encounter: Payer: Self-pay | Admitting: Podiatry

## 2018-09-22 VITALS — Temp 97.8°F

## 2018-09-22 DIAGNOSIS — M7742 Metatarsalgia, left foot: Secondary | ICD-10-CM | POA: Diagnosis not present

## 2018-09-22 DIAGNOSIS — M21962 Unspecified acquired deformity of left lower leg: Secondary | ICD-10-CM

## 2018-09-24 NOTE — Progress Notes (Signed)
  Subjective:  Patient ID: Brittany Vasquez, female    DOB: 08-Feb-1981,  MRN: [id number]  Chief Complaint  Patient presents with  . Callouses    Trim calluses plantar forefoot left    38 y.o. female presents for follow up foot pain.  States that the painful lesion on her left foot is recurred states the lesion in her heel went away.  Review of Systems: Negative except as noted in the HPI. Denies N/V/F/Ch.  Past Medical History:  Diagnosis Date  . Anemia   . Anxiety   . Depression    doing ok  . Fibromyalgia   . Mass of perirectal soft tissue - prob seb cyst 05/22/2012  . Rheumatoid arthritis ([place])   . Ulcerative colitis ([place])     Current Outpatient Medications:  .  calcium-vitamin D 250-100 MG-UNIT tablet, Take 1 tablet by mouth 2 (two) times daily., Disp: 60 tablet, Rfl: 6 .  flintstones complete (FLINTSTONES) 60 MG chewable tablet, Chew 2 tablets by mouth daily. , Disp: , Rfl:  .  ibuprofen (ADVIL,MOTRIN) 600 MG tablet, Take 1 tablet (600 mg total) by mouth every 6 (six) hours as needed., Disp: 30 tablet, Rfl: 0 .  sertraline (ZOLOFT) 50 MG tablet, Take 1 tablet (50 mg total) by mouth daily. Take 1/2 tab daily x 5-7 days, Disp: 90 tablet, Rfl: 1  Social History   Tobacco Use  Smoking Status Never Smoker  Smokeless Tobacco Never Used    No Known Allergies Objective:   Vitals:   09/22/18 0852  Temp: 97.8 F (36.6 C)   There is no height or weight on file to calculate BMI. Constitutional Well developed. Well nourished.  Vascular Dorsalis pedis pulses palpable bilaterally. Posterior tibial pulses palpable bilaterally. Capillary refill normal to all digits.  No cyanosis or clubbing noted. Pedal hair growth normal.  Neurologic Normal speech. Oriented to person, place, and time. Epicritic sensation to light touch grossly present bilaterally.  Dermatologic Nails well groomed and normal in appearance. No open wounds. HPK L submet 2/3 tender to palpation. HPK L  heel but non-tender  Orthopedic: Normal joint ROM without pain or crepitus bilaterally. No visible deformities. POP L subment 2/3.   Radiographs: None today Assessment:   1. Metatarsalgia of left foot   2. Deformity of metatarsal bone of left foot    Plan:  Patient was evaluated and treated and all questions answered.  Pes Planus, Metatarsalgia, Porokeratosis -Lesion sub-met 3 debrided with 312 blade to patient relief.  Offered injection but declined.  Will fabricate custom molded orthotics once approval is received from the [place].  Will offload the area aggressively to prevent recurrence of pain  Return in about 8 weeks (around 11/17/2018) for Capsulitis f/u left 3rd MPJ .

## 2018-09-26 DIAGNOSIS — F331 Major depressive disorder, recurrent, moderate: Secondary | ICD-10-CM | POA: Diagnosis not present

## 2018-09-27 DIAGNOSIS — F331 Major depressive disorder, recurrent, moderate: Secondary | ICD-10-CM | POA: Diagnosis not present

## 2018-10-11 DIAGNOSIS — F331 Major depressive disorder, recurrent, moderate: Secondary | ICD-10-CM | POA: Diagnosis not present

## 2018-10-11 DIAGNOSIS — F321 Major depressive disorder, single episode, moderate: Secondary | ICD-10-CM | POA: Diagnosis not present

## 2018-10-11 DIAGNOSIS — F411 Generalized anxiety disorder: Secondary | ICD-10-CM | POA: Diagnosis not present

## 2018-10-18 DIAGNOSIS — F331 Major depressive disorder, recurrent, moderate: Secondary | ICD-10-CM | POA: Diagnosis not present

## 2018-10-25 DIAGNOSIS — F331 Major depressive disorder, recurrent, moderate: Secondary | ICD-10-CM | POA: Diagnosis not present

## 2018-11-08 DIAGNOSIS — F331 Major depressive disorder, recurrent, moderate: Secondary | ICD-10-CM | POA: Diagnosis not present

## 2018-11-15 DIAGNOSIS — R51 Headache: Secondary | ICD-10-CM | POA: Diagnosis not present

## 2018-11-17 ENCOUNTER — Ambulatory Visit: Payer: Medicare Other | Admitting: Podiatry

## 2018-11-17 DIAGNOSIS — F321 Major depressive disorder, single episode, moderate: Secondary | ICD-10-CM | POA: Diagnosis not present

## 2018-11-17 DIAGNOSIS — F411 Generalized anxiety disorder: Secondary | ICD-10-CM | POA: Diagnosis not present

## 2018-11-29 DIAGNOSIS — F331 Major depressive disorder, recurrent, moderate: Secondary | ICD-10-CM | POA: Diagnosis not present

## 2018-12-13 DIAGNOSIS — F331 Major depressive disorder, recurrent, moderate: Secondary | ICD-10-CM | POA: Diagnosis not present

## 2018-12-22 DIAGNOSIS — F321 Major depressive disorder, single episode, moderate: Secondary | ICD-10-CM | POA: Diagnosis not present

## 2018-12-22 DIAGNOSIS — F411 Generalized anxiety disorder: Secondary | ICD-10-CM | POA: Diagnosis not present

## 2018-12-29 ENCOUNTER — Other Ambulatory Visit: Payer: Self-pay

## 2018-12-29 ENCOUNTER — Ambulatory Visit (INDEPENDENT_AMBULATORY_CARE_PROVIDER_SITE_OTHER): Payer: Non-veteran care | Admitting: Podiatry

## 2018-12-29 DIAGNOSIS — Q828 Other specified congenital malformations of skin: Secondary | ICD-10-CM

## 2019-01-02 NOTE — Progress Notes (Signed)
Patient ID: Brittany Vasquez, female   DOB: 23-Oct-1980, 38 y.o.   MRN: [id number]   Subjective:  The patient presents for followup.  States that the lesion to the left plantar foot has returned and is causing severe pain that is inhibiting her ability to ambulate.  Objective:   Constitutional Well developed. Well nourished.  Vascular Dorsalis pedis pulses palpable bilaterally. Posterior tibial pulses palpable bilaterally. Capillary refill normal to all digits. No cyanosis or clubbing noted. Pedal hair growth normal.  Neurologic Normal speech. Oriented to person, place, and time. Epicritic sensation to light touch grossly present bilaterally.  Dermatologic Hyperkeratotic lesion present to the plantar aspect of the second and third metatarsal area with pain to palpation.  Orthopedic: Normal joint ROM without pain or crepitus bilaterally. No visible deformities. No bony tenderness.    Assessment/Plan:  Patient was evaluated and treated and all questions answered.  Metatarsalgia and porokeratosis. - Lesion sharply debrided with 312 blade to patient's relief.  Area offloaded with padding.  Salinocaine applied to destroy the lesion.  We will follow up in six weeks for recheck.

## 2019-01-03 DIAGNOSIS — F331 Major depressive disorder, recurrent, moderate: Secondary | ICD-10-CM | POA: Diagnosis not present

## 2019-01-10 DIAGNOSIS — F321 Major depressive disorder, single episode, moderate: Secondary | ICD-10-CM | POA: Diagnosis not present

## 2019-01-10 DIAGNOSIS — F411 Generalized anxiety disorder: Secondary | ICD-10-CM | POA: Diagnosis not present

## 2019-01-17 DIAGNOSIS — F331 Major depressive disorder, recurrent, moderate: Secondary | ICD-10-CM | POA: Diagnosis not present

## 2019-02-05 DIAGNOSIS — F321 Major depressive disorder, single episode, moderate: Secondary | ICD-10-CM | POA: Diagnosis not present

## 2019-02-05 DIAGNOSIS — F411 Generalized anxiety disorder: Secondary | ICD-10-CM | POA: Diagnosis not present

## 2019-02-08 ENCOUNTER — Ambulatory Visit (INDEPENDENT_AMBULATORY_CARE_PROVIDER_SITE_OTHER): Payer: Non-veteran care | Admitting: Podiatry

## 2019-02-08 ENCOUNTER — Other Ambulatory Visit: Payer: Self-pay

## 2019-02-08 DIAGNOSIS — D239 Other benign neoplasm of skin, unspecified: Secondary | ICD-10-CM

## 2019-02-08 DIAGNOSIS — M722 Plantar fascial fibromatosis: Secondary | ICD-10-CM

## 2019-02-08 DIAGNOSIS — Q828 Other specified congenital malformations of skin: Secondary | ICD-10-CM

## 2019-02-08 NOTE — Patient Instructions (Signed)

## 2019-02-08 NOTE — Progress Notes (Signed)
  Subjective:  Patient ID: Brittany Vasquez, female    DOB: Dec 28, 1980,  MRN: [id number]  Chief Complaint  Patient presents with  . Foot Pain    Left plantar lesion is the same and still painful.  . Plantar Fasciitis    Pt states flare up in right plantar heel.   38 y.o. female presents with the above complaint. Hx as above.  Review of Systems: Negative except as noted in the HPI. Denies N/V/F/Ch.  Past Medical History:  Diagnosis Date  . Anemia   . Anxiety   . Depression    doing ok  . Fibromyalgia   . Mass of perirectal soft tissue - prob seb cyst 05/22/2012  . Rheumatoid arthritis ([place])   . Ulcerative colitis ([place])     Current Outpatient Medications:  .  calcium-vitamin D 250-100 MG-UNIT tablet, Take 1 tablet by mouth 2 (two) times daily., Disp: 60 tablet, Rfl: 6 .  flintstones complete (FLINTSTONES) 60 MG chewable tablet, Chew 2 tablets by mouth daily. , Disp: , Rfl:  .  ibuprofen (ADVIL,MOTRIN) 600 MG tablet, Take 1 tablet (600 mg total) by mouth every 6 (six) hours as needed., Disp: 30 tablet, Rfl: 0 .  lurasidone (LATUDA) 20 MG TABS tablet, Take by mouth., Disp: , Rfl:  .  sertraline (ZOLOFT) 50 MG tablet, Take 1 tablet (50 mg total) by mouth daily. Take 1/2 tab daily x 5-7 days, Disp: 90 tablet, Rfl: 1  Social History   Tobacco Use  Smoking Status Never Smoker  Smokeless Tobacco Never Used    No Known Allergies Objective:  There were no vitals filed for this visit. There is no height or weight on file to calculate BMI. Constitutional Well developed. Well nourished.  Vascular Dorsalis pedis pulses palpable bilaterally. Posterior tibial pulses palpable bilaterally. Capillary refill normal to all digits.  No cyanosis or clubbing noted. Pedal hair growth normal.  Neurologic Normal speech. Oriented to person, place, and time. Epicritic sensation to light touch grossly present bilaterally.  Dermatologic Nails well groomed and normal in appearance. No open  wounds. No skin lesions.  Orthopedic: Normal joint ROM without pain or crepitus bilaterally. No visible deformities. Tender to palpation at the calcaneal tuber right. No pain with calcaneal squeeze right. Ankle ROM diminished range of motion right. Silfverskiold Test: positive right.   Radiographs: None  Assessment:   1. Plantar fasciitis   2. Porokeratosis    Plan:  Patient was evaluated and treated and all questions answered.  Plantar Fasciitis, right - Injection delivered to the plantar fascia as below.  Procedure: Injection Tendon/Ligament Location: Right plantar fascia at the glabrous junction; medial approach. Skin Prep: alcohol Injectate: 1 cc 0.5% marcaine plain, 1 cc dexamethasone phosphate, 0.5 cc kenalog 10. Disposition: Patient tolerated procedure well. Injection site dressed with a band-aid.  Porokeratosis left -Lesion debrided and destroyed as below  Procedure: Destruction of Lesion Location: left forefoot Anesthesia: none Instrumentation: 15 blade. Technique: Debridement of lesion, Small amount of salinocaine applied to the base of the lesion. Dressing: Dry, sterile, compression dressing. Disposition: Patient tolerated procedure well. Advised to leave dressing on for 6-8 hours. Thereafter patient to wash the area with soap and water and applied band-aid. Off-loading pads dispensed. Patient to return in 2 weeks for follow-up.  Return in about 3 weeks (around 03/01/2019) for Plantar fasciitis, Right.

## 2019-03-08 ENCOUNTER — Other Ambulatory Visit: Payer: Self-pay

## 2019-03-08 ENCOUNTER — Ambulatory Visit (INDEPENDENT_AMBULATORY_CARE_PROVIDER_SITE_OTHER): Payer: No Typology Code available for payment source | Admitting: Podiatry

## 2019-03-08 DIAGNOSIS — Q828 Other specified congenital malformations of skin: Secondary | ICD-10-CM

## 2019-03-08 DIAGNOSIS — M722 Plantar fascial fibromatosis: Secondary | ICD-10-CM | POA: Diagnosis not present

## 2019-04-08 NOTE — Progress Notes (Signed)
  Subjective:  Patient ID: Brittany Vasquez, female    DOB: Jan 04, 1981,  MRN: [id number]  Chief Complaint  Patient presents with  . Foot Pain    pt is here for a f/u on plantar fasciitis, pt states that she is doing alot better since the last time she was here, pt also states that she is concerned that she will have foot pain in the future   39 y.o. female presents with the above complaint. Hx as above. States the injection helped a lot  Review of Systems: Negative except as noted in the HPI. Denies N/V/F/Ch.  Past Medical History:  Diagnosis Date  . Anemia   . Anxiety   . Depression    doing ok  . Fibromyalgia   . Mass of perirectal soft tissue - prob seb cyst 05/22/2012  . Rheumatoid arthritis ([place])   . Ulcerative colitis ([place])     Current Outpatient Medications:  .  calcium-vitamin D 250-100 MG-UNIT tablet, Take 1 tablet by mouth 2 (two) times daily., Disp: 60 tablet, Rfl: 6 .  flintstones complete (FLINTSTONES) 60 MG chewable tablet, Chew 2 tablets by mouth daily. , Disp: , Rfl:  .  ibuprofen (ADVIL,MOTRIN) 600 MG tablet, Take 1 tablet (600 mg total) by mouth every 6 (six) hours as needed., Disp: 30 tablet, Rfl: 0 .  lurasidone (LATUDA) 20 MG TABS tablet, Take by mouth., Disp: , Rfl:  .  sertraline (ZOLOFT) 50 MG tablet, Take 1 tablet (50 mg total) by mouth daily. Take 1/2 tab daily x 5-7 days, Disp: 90 tablet, Rfl: 1  Social History   Tobacco Use  Smoking Status Never Smoker  Smokeless Tobacco Never Used    No Known Allergies Objective:  There were no vitals filed for this visit. There is no height or weight on file to calculate BMI. Constitutional Well developed. Well nourished.  Vascular Dorsalis pedis pulses palpable bilaterally. Posterior tibial pulses palpable bilaterally. Capillary refill normal to all digits.  No cyanosis or clubbing noted. Pedal hair growth normal.  Neurologic Normal speech. Oriented to person, place, and time. Epicritic sensation to  light touch grossly present bilaterally.  Dermatologic Nails well groomed and normal in appearance. No open wounds. No skin lesions.  Orthopedic: Normal joint ROM without pain or crepitus bilaterally. No visible deformities. Tender to palpation at the calcaneal tuber right. No pain with calcaneal squeeze right. Ankle ROM diminished range of motion right. Silfverskiold Test: positive right.   Radiographs: None  Assessment:   1. Porokeratosis   2. Plantar fasciitis    Plan:  Patient was evaluated and treated and all questions answered.  Plantar fasciitis -Improving, no injection today  Porokeratosis left -Lesion debrided and destroyed as below  Procedure: Destruction of Lesion Location: left foot Anesthesia: none Instrumentation: 15 blade. Technique: Debridement of lesion to petechial bleeding. Aperture pad applied around lesion. Small amount of salinocaine applied to the base of the lesion. Dressing: Dry, sterile, compression dressing. Disposition: Patient tolerated procedure well. Advised to leave dressing on for 6-8 hours. Thereafter patient to wash the area with soap and water and applied band-aid. Off-loading pads dispensed.   Return in about 6 weeks (around 04/19/2019) for Porokeratosis f/u .

## 2019-04-09 DIAGNOSIS — F321 Major depressive disorder, single episode, moderate: Secondary | ICD-10-CM | POA: Diagnosis not present

## 2019-04-09 DIAGNOSIS — F411 Generalized anxiety disorder: Secondary | ICD-10-CM | POA: Diagnosis not present

## 2019-04-19 ENCOUNTER — Other Ambulatory Visit: Payer: Self-pay

## 2019-04-19 ENCOUNTER — Ambulatory Visit (INDEPENDENT_AMBULATORY_CARE_PROVIDER_SITE_OTHER): Payer: Non-veteran care | Admitting: Podiatry

## 2019-04-19 DIAGNOSIS — Q828 Other specified congenital malformations of skin: Secondary | ICD-10-CM | POA: Diagnosis not present

## 2019-04-19 NOTE — Progress Notes (Signed)
  Subjective:  Patient ID: Brittany Vasquez, female    DOB: [birth year],  MRN: [id number]  Chief Complaint  Patient presents with  . Follow-up    Porokeratosis (L) + plantar fasciitis (bilateral). Pt stated, "I'm doing better with both issues. My pain from plantar fasciitis varies with my activity levels. Today's pain = 2/10".    39 y.o. female presents with the above complaint. History confirmed with patient.  Objective:  Physical Exam: warm, good capillary refill, no trophic changes or ulcerative lesions, normal DP and PT pulses and normal sensory exam. Left Foot: punctate keratosis left forefoot    Assessment:   1. Porokeratosis      Plan:  Patient was evaluated and treated and all questions answered.  Porokeratosis -lesion debrided and destroyed  Procedure: Destruction of Lesion Location: left forefoot Anesthesia: none Instrumentation: dermal curette. Technique: Debridement of lesion to petechial bleeding. Aperture pad applied around lesion. Small amount of salinocaine applied to the base of the lesion. Dressing: Dry, sterile, compression dressing. Disposition: Patient tolerated procedure well. Advised to leave dressing on for 6-8 hours. Thereafter patient to wash the area with soap and water and applied band-aid. Off-loading pads dispensed. Patient to return in 2 weeks for follow-up.   Return in about 3 months (around 07/18/2019).   MDM

## 2019-05-23 ENCOUNTER — Telehealth: Payer: Self-pay

## 2019-05-23 ENCOUNTER — Other Ambulatory Visit: Payer: Self-pay

## 2019-05-23 MED ORDER — CEPHALEXIN 500 MG PO CAPS: 500.0000 mg | ORAL_CAPSULE | Freq: Two times a day (BID) | ORAL | 0 refills | Status: DC

## 2019-05-23 NOTE — Telephone Encounter (Signed)
Received called from patient, she reports having mastitis again and would like something called into the pharmacy. She reports having a 77 month old she is breast feeding. She reports feeling chills,engorgement and feels like she has a temp. Per: Mallie Snooks will go ahead and call in keflex 500mg  since we are about to closed and will be close tomorrow due to weather. I have advised patient if she has not started to feel better she will need to be seen before any more medications can be prescribed at this time. I ask patient to send [place] a picture of her breast using my chart but patient was having difficulty uploading her pictures.Marland Kitchen

## 2019-06-05 DIAGNOSIS — F411 Generalized anxiety disorder: Secondary | ICD-10-CM | POA: Diagnosis not present

## 2019-06-05 DIAGNOSIS — F321 Major depressive disorder, single episode, moderate: Secondary | ICD-10-CM | POA: Diagnosis not present

## 2019-06-28 DIAGNOSIS — F321 Major depressive disorder, single episode, moderate: Secondary | ICD-10-CM | POA: Diagnosis not present

## 2019-06-28 DIAGNOSIS — F411 Generalized anxiety disorder: Secondary | ICD-10-CM | POA: Diagnosis not present

## 2019-07-19 ENCOUNTER — Ambulatory Visit: Payer: No Typology Code available for payment source | Admitting: Podiatry

## 2019-07-23 DIAGNOSIS — F321 Major depressive disorder, single episode, moderate: Secondary | ICD-10-CM | POA: Diagnosis not present

## 2019-07-23 DIAGNOSIS — F411 Generalized anxiety disorder: Secondary | ICD-10-CM | POA: Diagnosis not present

## 2019-08-06 DIAGNOSIS — F321 Major depressive disorder, single episode, moderate: Secondary | ICD-10-CM | POA: Diagnosis not present

## 2019-08-06 DIAGNOSIS — F411 Generalized anxiety disorder: Secondary | ICD-10-CM | POA: Diagnosis not present

## 2019-08-29 DIAGNOSIS — F321 Major depressive disorder, single episode, moderate: Secondary | ICD-10-CM | POA: Diagnosis not present

## 2019-08-29 DIAGNOSIS — F411 Generalized anxiety disorder: Secondary | ICD-10-CM | POA: Diagnosis not present

## 2019-09-06 ENCOUNTER — Telehealth: Payer: Self-pay | Admitting: Podiatry

## 2019-09-06 ENCOUNTER — Other Ambulatory Visit: Payer: Self-pay | Admitting: Podiatry

## 2019-09-06 ENCOUNTER — Ambulatory Visit (INDEPENDENT_AMBULATORY_CARE_PROVIDER_SITE_OTHER): Payer: Medicare Other | Admitting: Podiatry

## 2019-09-06 ENCOUNTER — Other Ambulatory Visit: Payer: Self-pay

## 2019-09-06 ENCOUNTER — Ambulatory Visit (INDEPENDENT_AMBULATORY_CARE_PROVIDER_SITE_OTHER): Payer: Medicare Other

## 2019-09-06 VITALS — Temp 97.3°F

## 2019-09-06 DIAGNOSIS — M722 Plantar fascial fibromatosis: Secondary | ICD-10-CM

## 2019-09-06 DIAGNOSIS — M25571 Pain in right ankle and joints of right foot: Secondary | ICD-10-CM | POA: Diagnosis not present

## 2019-09-06 DIAGNOSIS — B078 Other viral warts: Secondary | ICD-10-CM

## 2019-09-06 DIAGNOSIS — B079 Viral wart, unspecified: Secondary | ICD-10-CM

## 2019-09-06 DIAGNOSIS — M76821 Posterior tibial tendinitis, right leg: Secondary | ICD-10-CM

## 2019-09-06 DIAGNOSIS — M25572 Pain in left ankle and joints of left foot: Secondary | ICD-10-CM

## 2019-09-06 DIAGNOSIS — M76822 Posterior tibial tendinitis, left leg: Secondary | ICD-10-CM

## 2019-09-06 MED ORDER — MELOXICAM 15 MG PO TABS: 15.0000 mg | ORAL_TABLET | Freq: Every day | ORAL | 0 refills | Status: DC

## 2019-09-06 NOTE — Progress Notes (Signed)
  Subjective:  Patient ID: Brittany Vasquez, female    DOB: [birth year],  MRN: [id number]  Chief Complaint  Patient presents with  . Follow-up    No pain from L porokeratosis per pt. H/o bilateral plantar fasciitis (heels and midfoot). Pt stated, "I've been walking a lot for exercise in the past 5 weeks, which has triggered plantar fasciitis and a new pain on the [lateral] side of my L foot. I can also feel it [near medial sides of ankles - bilateral]. Pain = 5/10".    39 y.o. female presents with the above complaint. History confirmed with patient.   Objective:  Physical Exam: warm, good capillary refill, no trophic changes or ulcerative lesions, normal DP and PT pulses and normal sensory exam. Left Foot: porokeratosis submet 3 Pain at PT tendon, medial calc tuber, sinus tarsi Right Foot: Pain at PT tendon, medial calc tuber, sinus tarsi No images are attached to the encounter.  Radiographs: X-ray of both feet: no fracture, dislocation, swelling or degenerative changes noted; pes planus Assessment:   1. Plantar fasciitis   2. Verruca   3. Sinus tarsi syndrome of both ankles   4. Posterior tibial tendinitis of both lower extremities      Plan:  Patient was evaluated and treated and all questions answered.  Pes Planus, Plantar fasciitis -X-rays reviewed as above -Dispensed Tri-Lock ankle brace.  Patient educated on use  -Continue CMOs -Rx Meloxicam  Porokeratosis  -Lesion destroyed left   Return in about 6 weeks (around 10/18/2019).

## 2019-10-18 ENCOUNTER — Ambulatory Visit: Payer: Medicare Other | Admitting: Podiatry

## 2019-11-21 DIAGNOSIS — Z20822 Contact with and (suspected) exposure to covid-19: Secondary | ICD-10-CM | POA: Diagnosis not present

## 2019-12-17 DIAGNOSIS — F321 Major depressive disorder, single episode, moderate: Secondary | ICD-10-CM | POA: Diagnosis not present

## 2019-12-17 DIAGNOSIS — F411 Generalized anxiety disorder: Secondary | ICD-10-CM | POA: Diagnosis not present

## 2019-12-31 DIAGNOSIS — F411 Generalized anxiety disorder: Secondary | ICD-10-CM | POA: Diagnosis not present

## 2019-12-31 DIAGNOSIS — F321 Major depressive disorder, single episode, moderate: Secondary | ICD-10-CM | POA: Diagnosis not present

## 2020-02-01 ENCOUNTER — Other Ambulatory Visit: Payer: Self-pay

## 2020-02-01 ENCOUNTER — Ambulatory Visit (INDEPENDENT_AMBULATORY_CARE_PROVIDER_SITE_OTHER): Payer: Medicare Other | Admitting: Podiatry

## 2020-02-01 ENCOUNTER — Ambulatory Visit (INDEPENDENT_AMBULATORY_CARE_PROVIDER_SITE_OTHER): Payer: Medicare Other

## 2020-02-01 DIAGNOSIS — M19079 Primary osteoarthritis, unspecified ankle and foot: Secondary | ICD-10-CM

## 2020-02-01 DIAGNOSIS — M25571 Pain in right ankle and joints of right foot: Secondary | ICD-10-CM | POA: Diagnosis not present

## 2020-02-01 DIAGNOSIS — M25372 Other instability, left ankle: Secondary | ICD-10-CM

## 2020-02-01 DIAGNOSIS — M76821 Posterior tibial tendinitis, right leg: Secondary | ICD-10-CM

## 2020-02-01 DIAGNOSIS — M722 Plantar fascial fibromatosis: Secondary | ICD-10-CM

## 2020-02-01 DIAGNOSIS — M76822 Posterior tibial tendinitis, left leg: Secondary | ICD-10-CM | POA: Diagnosis not present

## 2020-02-01 DIAGNOSIS — S9002XA Contusion of left ankle, initial encounter: Secondary | ICD-10-CM | POA: Diagnosis not present

## 2020-02-01 DIAGNOSIS — M25572 Pain in left ankle and joints of left foot: Secondary | ICD-10-CM | POA: Diagnosis not present

## 2020-02-01 NOTE — Progress Notes (Signed)
°  Subjective:  Patient ID: Brittany Vasquez, female    DOB: 1980-07-15,  MRN: [id number]  Chief Complaint  Patient presents with   Ankle Pain    pain and popping movement-     39 y.o. female presents with the above complaint. History confirmed with patient. States that the left ankle has been popping recently, but not happening today  Objective:  Physical Exam: warm, good capillary refill, no trophic changes or ulcerative lesions, normal DP and PT pulses and normal sensory exam. Left Foot: mild porokeratosis submet 3. Left ankle pain at the ATFL. Right Foot: POP dorsal midfoot with prominent osteophyte formation  Assessment:   1. Ankle instability, left   2. Posterior tibial tendinitis of both lower extremities   3. Sinus tarsi syndrome of both ankles   4. Plantar fasciitis   5. Arthritis of midfoot    Plan:  Patient was evaluated and treated and all questions answered.  Ankle instability left -Continue trilock brace -Would consider PT if pain persists -Patient not interested in surgery for this.  Midfoot OA right -Educated on etiology -Recc Voltaren gel  No follow-ups on file.

## 2020-02-14 DIAGNOSIS — F411 Generalized anxiety disorder: Secondary | ICD-10-CM | POA: Diagnosis not present

## 2020-02-14 DIAGNOSIS — F321 Major depressive disorder, single episode, moderate: Secondary | ICD-10-CM | POA: Diagnosis not present

## 2020-03-07 DIAGNOSIS — F411 Generalized anxiety disorder: Secondary | ICD-10-CM | POA: Diagnosis not present

## 2020-03-07 DIAGNOSIS — F321 Major depressive disorder, single episode, moderate: Secondary | ICD-10-CM | POA: Diagnosis not present

## 2020-04-07 DIAGNOSIS — F411 Generalized anxiety disorder: Secondary | ICD-10-CM | POA: Diagnosis not present

## 2020-04-11 ENCOUNTER — Ambulatory Visit: Payer: Medicare Other | Admitting: Podiatry

## 2020-04-28 DIAGNOSIS — M79675 Pain in left toe(s): Secondary | ICD-10-CM | POA: Diagnosis not present

## 2020-05-09 DIAGNOSIS — M79675 Pain in left toe(s): Secondary | ICD-10-CM | POA: Diagnosis not present

## 2020-06-06 DIAGNOSIS — F411 Generalized anxiety disorder: Secondary | ICD-10-CM | POA: Diagnosis not present

## 2020-06-14 DIAGNOSIS — M25561 Pain in right knee: Secondary | ICD-10-CM | POA: Diagnosis not present

## 2020-06-14 DIAGNOSIS — M545 Low back pain, unspecified: Secondary | ICD-10-CM | POA: Diagnosis not present

## 2020-06-14 DIAGNOSIS — M5441 Lumbago with sciatica, right side: Secondary | ICD-10-CM | POA: Diagnosis not present

## 2020-06-14 DIAGNOSIS — M67912 Unspecified disorder of synovium and tendon, left shoulder: Secondary | ICD-10-CM | POA: Diagnosis not present

## 2020-06-25 DIAGNOSIS — F411 Generalized anxiety disorder: Secondary | ICD-10-CM | POA: Diagnosis not present

## 2020-10-30 DIAGNOSIS — F411 Generalized anxiety disorder: Secondary | ICD-10-CM | POA: Diagnosis not present

## 2020-11-05 ENCOUNTER — Other Ambulatory Visit: Payer: Self-pay

## 2020-11-05 ENCOUNTER — Inpatient Hospital Stay: Payer: Medicare Other | Attending: Genetic Counselor | Admitting: Genetic Counselor

## 2020-11-05 ENCOUNTER — Inpatient Hospital Stay: Payer: Medicare Other

## 2020-11-05 DIAGNOSIS — Z800 Family history of malignant neoplasm of digestive organs: Secondary | ICD-10-CM

## 2020-11-05 DIAGNOSIS — Z803 Family history of malignant neoplasm of breast: Secondary | ICD-10-CM

## 2020-11-06 ENCOUNTER — Encounter: Payer: Self-pay | Admitting: Genetic Counselor

## 2020-11-06 DIAGNOSIS — Z803 Family history of malignant neoplasm of breast: Secondary | ICD-10-CM | POA: Insufficient documentation

## 2020-11-06 DIAGNOSIS — Z800 Family history of malignant neoplasm of digestive organs: Secondary | ICD-10-CM | POA: Insufficient documentation

## 2020-11-06 NOTE — Progress Notes (Signed)
REFERRING PROVIDER: Aura Dials, [place],  [place] [address]  PRIMARY PROVIDER:  Aura Dials, MD  PRIMARY REASON FOR VISIT:  1. Family history of breast cancer   2. Family history of colon cancer      HISTORY OF PRESENT ILLNESS:   Brittany Vasquez, a 40 y.o. female, was seen for a [facility] cancer genetics consultation at the request of Dr. Sheryn Bison due to a family history of breast, colon and uterine/ovarian cancer.  Brittany Vasquez presents to clinic today to discuss the possibility of a hereditary predisposition to cancer, genetic testing, and to further clarify her future cancer risks, as well as potential cancer risks for family members.    Brittany Vasquez is a 40 y.o. female with no personal history of cancer.    CANCER HISTORY:  Oncology History   No history exists.    Past Medical History:  Diagnosis Date   Anemia    Anxiety    Depression    doing ok   Family history of breast cancer    Fibromyalgia    Mass of perirectal soft tissue - prob seb cyst 05/22/2012   Rheumatoid arthritis ([place])    Ulcerative colitis ([place])     Past Surgical History:  Procedure Laterality Date   CESAREAN SECTION  01/27/2012   Procedure: CESAREAN SECTION;  Surgeon: Thurnell Lose, MD;  Location: [facility];  Service: Obstetrics;  Laterality: N/A;  Primary Cesarean Section with birth of baby boy @ [facility] History   Marital status: Married    Spouse name: Not on file   Number of children: 2   Years of education: Not on file   Highest education level: Not on file  Occupational History   Not on file  Tobacco Use   Smoking status: Never   Smokeless tobacco: Never  Vaping Use   Vaping Use: Never used  Substance and Sexual Activity   Alcohol use: No   Drug use: No   Sexual activity: Yes    Birth control/protection: None  Other Topics Concern   Not on file  Social History Narrative   Patient lives at home with her Fiance. Patient has a B.S.  Degree and has 2 children. Patient denies any tobacco, alcohol, and illicit drug use.    Social Determinants of Health   Financial Resource Strain: Not on file  Food Insecurity: Not on file  Transportation Needs: Not on file  Physical Activity: Not on file  Stress: Not on file  Social Connections: Not on file     FAMILY HISTORY:  We obtained a detailed, 4-generation family history.  Significant diagnoses are listed below: Family History  Problem Relation Age of Onset   Breast cancer Mother 68       breast cancer returned again at age 57   Hypertension Mother    Brain cancer Maternal Aunt 54   Cancer Maternal Aunt        uterine vs ovarian   Cancer Maternal Aunt        uterine vs ovarian   Cancer Maternal Aunt        uterine vs ovariant   Colon cancer Maternal Uncle    Breast cancer Maternal Grandmother 80   Stroke Paternal Grandfather      Ms. Halbert is unaware of previous family history of genetic testing for hereditary cancer risks. Patient's maternal ancestors are of African American descent, and paternal ancestors are of African  American descent. There is no reported Ashkenazi Jewish ancestry. There is no known consanguinity.  GENETIC COUNSELING ASSESSMENT: [name] is a 40 y.o. female with a family history of breast, colon, and uterine/ovarian which is somewhat suggestive of a hereditary cancer syndrome and predisposition to cancer given the type of cancer in the family and the young ages of onset. We, therefore, discussed and recommended the following at today's visit.   DISCUSSION: We discussed that 5 - 10% of breast cancer is hereditary, with most cases associated with BRCA mutations.  There are other genes that can be associated with hereditary breast cancer syndromes.  These include ATM, CHEK2 and PALB2. The type of cancer and their risk is gene-dependent.  We discussed that testing is beneficial for several reasons including knowing how to follow individuals and  understand if other family members could be at risk for cancer and allow them to undergo genetic testing.   We reviewed the characteristics, features and inheritance patterns of hereditary cancer syndromes. We also discussed genetic testing, including the appropriate family members to test, the process of testing, insurance coverage and turn-around-time for results. We discussed the implications of a negative, positive, carrier and/or variant of uncertain significant result. We recommended Ms. Mcshea pursue genetic testing for the Multi-cancer gene panel. The Multi-Gene Panel offered by [facility] includes sequencing and/or deletion duplication testing of the following 84 genes: AIP, ALK, APC, ATM, AXIN2,BAP1,  BARD1, BLM, BMPR1A, BRCA1, BRCA2, BRIP1, CASR, CDC73, CDH1, CDK4, CDKN1B, CDKN1C, CDKN2A (p14ARF), CDKN2A (p16INK4a), CEBPA, CHEK2, CTNNA1, DICER1, DIS3L2, EGFR (c.2369C>T, p.Thr790Met variant only), EPCAM (Deletion/duplication testing only), FH, FLCN, GATA2, GPC3, GREM1 (Promoter region deletion/duplication testing only), HOXB13 (c.251G>A, p.Gly84Glu), HRAS, KIT, MAX, MEN1, MET, MITF (c.952G>A, p.Glu318Lys variant only), MLH1, MSH2, MSH3, MSH6, MUTYH, NBN, NF1, NF2, NTHL1, PALB2, PDGFRA, PHOX2B, PMS2, POLD1, POLE, POT1, PRKAR1A, PTCH1, PTEN, RAD50, RAD51C, RAD51D, RB1, RECQL4, RET, RUNX1, SDHAF2, SDHA (sequence changes only), SDHB, SDHC, SDHD, SMAD4, SMARCA4, SMARCB1, SMARCE1, STK11, SUFU, TERC, TERT, TMEM127, TP53, TSC1, TSC2, VHL, WRN and WT1.    Based on Ms. Gaillard's family history of cancer, she meets medical criteria for genetic testing. Despite that she meets criteria, she may still have an out of pocket cost. We discussed that if her out of pocket cost for testing is over $100, the laboratory will call and confirm whether she wants to proceed with testing.  If the out of pocket cost of testing is less than $100 she will be billed by the genetic testing laboratory.   PLAN: After considering the  risks, benefits, and limitations, Ms. Macfadden provided informed consent to pursue genetic testing and the blood sample was sent to [facility] for analysis of the multi-cancer panel+RNA. Results should be available within approximately 2-3 weeks' time, at which point they will be disclosed by telephone to Ms. Kumagai, as will any additional recommendations warranted by these results. Ms. Lingle will receive a summary of her genetic counseling visit and a copy of her results once available. This information will also be available in Epic.   Lastly, we encouraged Ms. Onstott to remain in contact with cancer genetics annually so that we can continuously update the family history and inform her of any changes in cancer genetics and testing that may be of benefit for this family.   Ms. Athens questions were answered to her satisfaction today. Our contact information was provided should additional questions or concerns arise. Thank you for the referral and allowing [place] to share in the care of your  patient.   Venetia Prewitt P. Florene Glen, [place], [facility] Licensed, Insurance risk surveyor [email or web address] phone: [phone]  The patient was seen for a total of 35 minutes in face-to-face genetic counseling.  The patient was seen alone.  This patient was discussed with Drs. Magrinat, Lindi Adie and/or Burr Medico who agrees with the above.    _______________________________________________________________________ For Office Staff:  Number of people involved in session: 1 Was an Intern/ student involved with case: no

## 2020-11-26 ENCOUNTER — Telehealth: Payer: Self-pay | Admitting: Genetic Counselor

## 2020-11-26 NOTE — Telephone Encounter (Signed)
Patient called inquiring about results of genetics.  No results available.  Will contact when results are available.

## 2020-12-18 ENCOUNTER — Encounter: Payer: Self-pay | Admitting: Genetic Counselor

## 2020-12-18 ENCOUNTER — Telehealth: Payer: Self-pay | Admitting: Genetic Counselor

## 2020-12-18 ENCOUNTER — Ambulatory Visit: Payer: Self-pay | Admitting: Genetic Counselor

## 2020-12-18 DIAGNOSIS — Z1379 Encounter for other screening for genetic and chromosomal anomalies: Secondary | ICD-10-CM

## 2020-12-18 NOTE — Telephone Encounter (Signed)
Revealed negative genetic testing.  Discussed that we do not know why there is cancer in the family. It could be due to a different gene that we are not testing, or maybe our current technology may not be able to pick something up.  It will be important for her to keep in contact with genetics to keep up with whether additional testing may be needed.  One VUS was found which will not change medical management.

## 2020-12-18 NOTE — Progress Notes (Signed)
HPI:  Ms. Rogel was previously seen in the [facility] clinic due to a family history of breast and other cancers and concerns regarding a hereditary predisposition to cancer. Please refer to our prior cancer genetics clinic note for more information regarding our discussion, assessment and recommendations, at the time. Ms. Packham recent genetic test results were disclosed to her, as were recommendations warranted by these results. These results and recommendations are discussed in more detail below.  CANCER HISTORY:  Oncology History   No history exists.    FAMILY HISTORY:  We obtained a detailed, 4-generation family history.  Significant diagnoses are listed below: Family History  Problem Relation Age of Onset   Breast cancer Mother 57       breast cancer returned again at age 68   Hypertension Mother    Brain cancer Maternal Aunt 70   Cancer Maternal Aunt        uterine vs ovarian   Cancer Maternal Aunt        uterine vs ovarian   Cancer Maternal Aunt        uterine vs ovariant   Colon cancer Maternal Uncle    Breast cancer Maternal Grandmother 80   Stroke Paternal Grandfather     Ms. Humphres is unaware of previous family history of genetic testing for hereditary cancer risks. Patient's maternal ancestors are of African American descent, and paternal ancestors are of African American descent. There is no reported Ashkenazi Jewish ancestry. There is no known consanguinity.  GENETIC TEST RESULTS: Genetic testing reported out on December 15, 2020 through the Multi-cancer +RNA panel found no pathogenic mutations. The Multi-Gene Panel offered by [facility] includes sequencing and/or deletion duplication testing of the following 84 genes: AIP, ALK, APC, ATM, AXIN2,BAP1,  BARD1, BLM, BMPR1A, BRCA1, BRCA2, BRIP1, CASR, CDC73, CDH1, CDK4, CDKN1B, CDKN1C, CDKN2A (p14ARF), CDKN2A (p16INK4a), CEBPA, CHEK2, CTNNA1, DICER1, DIS3L2, EGFR (c.2369C>T, p.Thr790Met variant only), EPCAM  (Deletion/duplication testing only), FH, FLCN, GATA2, GPC3, GREM1 (Promoter region deletion/duplication testing only), HOXB13 (c.251G>A, p.Gly84Glu), HRAS, KIT, MAX, MEN1, MET, MITF (c.952G>A, p.Glu318Lys variant only), MLH1, MSH2, MSH3, MSH6, MUTYH, NBN, NF1, NF2, NTHL1, PALB2, PDGFRA, PHOX2B, PMS2, POLD1, POLE, POT1, PRKAR1A, PTCH1, PTEN, RAD50, RAD51C, RAD51D, RB1, RECQL4, RET, RUNX1, SDHAF2, SDHA (sequence changes only), SDHB, SDHC, SDHD, SMAD4, SMARCA4, SMARCB1, SMARCE1, STK11, SUFU, TERC, TERT, TMEM127, TP53, TSC1, TSC2, VHL, WRN and WT1. The test report has been scanned into EPIC and is located under the Molecular Pathology section of the Results Review tab.  A portion of the result report is included below for reference.     We discussed with Ms. Solarz that because current genetic testing is not perfect, it is possible there may be a gene mutation in one of these genes that current testing cannot detect, but that chance is small.  We also discussed, that there could be another gene that has not yet been discovered, or that we have not yet tested, that is responsible for the cancer diagnoses in the family. It is also possible there is a hereditary cause for the cancer in the family that Ms. Livingston did not inherit and therefore was not identified in her testing.  Therefore, it is important to remain in touch with cancer genetics in the future so that we can continue to offer Ms. Flax the most up to date genetic testing.   Genetic testing did identify a variant of uncertain significance (VUS) was identified in the [facility] gene called c.872A>G (p.Gln291Arg).  At this time,  it is unknown if this variant is associated with increased cancer risk or if this is a normal finding, but most variants such as this get reclassified to being inconsequential. It should not be used to make medical management decisions. With time, we suspect the lab will determine the significance of this variant, if any. If we do  learn more about it, we will try to contact Ms. Spirito to discuss it further. However, it is important to stay in touch with [place] periodically and keep the address and phone number up to date.  ADDITIONAL GENETIC TESTING: We discussed with Ms. Shimer that her genetic testing was fairly extensive.  If there are genes identified to increase cancer risk that can be analyzed in the future, we would be happy to discuss and coordinate this testing at that time.    CANCER SCREENING RECOMMENDATIONS: Ms. Monaco test result is considered negative (normal).  This means that we have not identified a hereditary cause for her family history of breast and other cancers at this time. Most cancers happen by chance and this negative test suggests that her cancer may fall into this category.    While reassuring, this does not definitively rule out a hereditary predisposition to cancer. It is still possible that there could be genetic mutations that are undetectable by current technology. There could be genetic mutations in genes that have not been tested or identified to increase cancer risk.  Therefore, it is recommended she continue to follow the cancer management and screening guidelines provided by her primary healthcare provider.   An individual's cancer risk and medical management are not determined by genetic test results alone. Overall cancer risk assessment incorporates additional factors, including personal medical history, family history, and any available genetic information that may result in a personalized plan for cancer prevention and surveillance  RECOMMENDATIONS FOR FAMILY MEMBERS:  Individuals in this family might be at some increased risk of developing cancer, over the general population risk, simply due to the family history of cancer.  We recommended women in this family have a yearly mammogram beginning at age 42, or 16 years younger than the earliest onset of cancer, an annual clinical breast exam,  and perform monthly breast self-exams. Women in this family should also have a gynecological exam as recommended by their primary provider. All family members should be referred for colonoscopy starting at age 19.  It is also possible there is a hereditary cause for the cancer in Ms. Sendejo's family that she did not inherit and therefore was not identified in her.  Based on Ms. Lepp's family history, we recommended her mother, who was diagnosed with breast cancer at age 7, have genetic counseling and testing. Ms. Duarte will let us know if we can be of any assistance in coordinating genetic counseling and/or testing for this family member.   FOLLOW-UP: Lastly, we discussed with Ms. Grima that cancer genetics is a rapidly advancing field and it is possible that new genetic tests will be appropriate for her and/or her family members in the future. We encouraged her to remain in contact with cancer genetics on an annual basis so we can update her personal and family histories and let her know of advances in cancer genetics that may benefit this family.   Our contact number was provided. Ms. Nissley questions were answered to her satisfaction, and she knows she is welcome to call us at anytime with additional questions or concerns.   Roma Kayser, MS, [facility] Licensed, Certified  Genetic Counselor [email or web address]

## 2020-12-25 DIAGNOSIS — J019 Acute sinusitis, unspecified: Secondary | ICD-10-CM | POA: Diagnosis not present

## 2020-12-25 DIAGNOSIS — J309 Allergic rhinitis, unspecified: Secondary | ICD-10-CM | POA: Diagnosis not present

## 2021-01-01 NOTE — Telephone Encounter (Signed)
Error

## 2021-01-02 DIAGNOSIS — F411 Generalized anxiety disorder: Secondary | ICD-10-CM | POA: Diagnosis not present

## 2021-01-14 ENCOUNTER — Other Ambulatory Visit: Payer: Self-pay

## 2021-01-14 ENCOUNTER — Encounter (HOSPITAL_COMMUNITY): Payer: Self-pay

## 2021-01-14 ENCOUNTER — Emergency Department (HOSPITAL_COMMUNITY)
Admission: EM | Admit: 2021-01-14 | Discharge: 2021-01-15 | Disposition: A | Discharge status: Home / Self Care | Payer: No Typology Code available for payment source | Attending: Emergency Medicine | Admitting: Emergency Medicine

## 2021-01-14 ENCOUNTER — Emergency Department (HOSPITAL_COMMUNITY): Payer: No Typology Code available for payment source

## 2021-01-14 DIAGNOSIS — R072 Precordial pain: Secondary | ICD-10-CM | POA: Diagnosis not present

## 2021-01-14 DIAGNOSIS — R079 Chest pain, unspecified: Secondary | ICD-10-CM | POA: Diagnosis present

## 2021-01-14 LAB — BASIC METABOLIC PANEL
Anion gap: 6 (ref 5–15)
BUN: 16 mg/dL (ref 6–20)
CO2: 25 mmol/L (ref 22–32)
Calcium: 9.6 mg/dL (ref 8.9–10.3)
Chloride: 109 mmol/L (ref 98–111)
Creatinine, Ser: 0.65 mg/dL (ref 0.44–1.00)
GFR, Estimated: >60 mL/min (ref >60)
Glucose, Bld: 112 mg/dL — ABNORMAL HIGH (ref 70–99)
Potassium: 3.6 mmol/L (ref 3.5–5.1)
Sodium: 140 mmol/L (ref 135–145)

## 2021-01-14 LAB — I-STAT BETA HCG BLOOD, ED (MC, WL, AP ONLY): I-stat hCG, quantitative: <5 m[IU]/mL (ref <5)

## 2021-01-14 LAB — CBC
HCT: 40 % (ref 36.0–46.0)
Hemoglobin: 13.3 g/dL (ref 12.0–15.0)
MCH: 28.3 pg (ref 26.0–34.0)
MCHC: 33.3 g/dL (ref 30.0–36.0)
MCV: 85.1 fL (ref 80.0–100.0)
Platelets: 244 10*3/uL (ref 150–400)
RBC: 4.7 MIL/uL (ref 3.87–5.11)
RDW: 13.2 % (ref 11.5–15.5)
WBC: 6 10*3/uL (ref 4.0–10.5)
nRBC: 0 % (ref 0.0–0.2)

## 2021-01-14 LAB — TROPONIN I (HIGH SENSITIVITY): Troponin I (High Sensitivity): <2 ng/L (ref <18)

## 2021-01-14 NOTE — ED Triage Notes (Signed)
Pt reports mid to right sided chest pain x3 weeks. Denies SHOB, back, and N/V/D.

## 2021-01-14 NOTE — ED Provider Notes (Signed)
[name] Provider Note   CSN: [id number] Arrival date & time: 01/14/21  1935     History Chief Complaint  Patient presents with   Chest Pain    Brittany Vasquez is a 40 y.o. female.  Patient with past medical history notable for fibromyalgia and ulcerative colitis and rheumatoid arthritis presents to the emergency department with a chief complaint of right-sided chest pain x3 weeks.  Symptoms are worsened with palpation.  She states that she can also feel the pain upon the right side of her neck.  She denies any fever, chills, shortness of breath.  Denies any back pain.  Denies nausea, vomiting, diarrhea.  She denies any known trauma or injury.  She denies any recent travel.  Denies any history of PE or DVT.    The history is provided by the patient. No language interpreter was used.      Past Medical History:  Diagnosis Date   Anemia    Anxiety    Depression    doing ok   Family history of breast cancer    Fibromyalgia    Mass of perirectal soft tissue - prob seb cyst 05/22/2012   Rheumatoid arthritis ([place])    Ulcerative colitis [facility])     Patient Active Problem List   Diagnosis Date Noted   Genetic testing 12/18/2020   Family history of breast cancer 11/06/2020   Family history of colon cancer 11/06/2020   Depression 02/21/2018    Past Surgical History:  Procedure Laterality Date   CESAREAN SECTION  01/27/2012   Procedure: CESAREAN SECTION;  Surgeon: Thurnell Lose, MD;  Location: [facility];  Service: Obstetrics;  Laterality: N/A;  Primary Cesarean Section with birth of baby boy @ 89     OB History     Gravida  4   Para  4   Term  4   Preterm  0   AB  0   Living  4      SAB  0   IAB  0   Ectopic  0   Multiple  0   Live Births  4           Family History  Problem Relation Age of Onset   Breast cancer Mother 53       breast cancer returned again at age 71   Hypertension Mother    Brain cancer  Maternal Aunt 22   Cancer Maternal Aunt        uterine vs ovarian   Cancer Maternal Aunt        uterine vs ovarian   Cancer Maternal Aunt        uterine vs ovariant   Colon cancer Maternal Uncle    Breast cancer Maternal Grandmother 80   Stroke Paternal Grandfather     Social History   Tobacco Use   Smoking status: Never   Smokeless tobacco: Never  Vaping Use   Vaping Use: Never used  Substance Use Topics   Alcohol use: No   Drug use: No    Home Medications Prior to Admission medications   Medication Sig Start Date End Date Taking? Authorizing Provider  calcium-vitamin D 250-100 MG-UNIT tablet Take 1 tablet by mouth 2 (two) times daily. 07/19/17   Emily Filbert, MD  flintstones complete (FLINTSTONES) 60 MG chewable tablet Chew 2 tablets by mouth daily.     [provider]  ibuprofen (ADVIL,MOTRIN) 600 MG tablet Take 1 tablet (600 mg total) by  mouth every 6 (six) hours as needed. 02/16/18   Myrtis Ser, CNM  lurasidone (LATUDA) 20 MG TABS tablet Take by mouth.    [provider]  meloxicam (MOBIC) 15 MG tablet Take 1 tablet (15 mg total) by mouth daily. 09/06/19   Evelina Bucy, DPM    Allergies    Patient has no known allergies.  Review of Systems   Review of Systems  All other systems reviewed and are negative.  Physical Exam Updated Vital Signs BP (!) 139/93   Pulse 73   Temp 98.5 F (36.9 C) (Oral)   Resp 13   Ht 5\' 7"  (1.702 m)   Wt 90.7 kg   LMP 12/24/2020 (Approximate)   SpO2 100%   BMI 31.32 kg/m   Physical Exam Vitals and nursing note reviewed.  Constitutional:      General: She is not in acute distress.    Appearance: She is well-developed.  HENT:     Head: Normocephalic and atraumatic.  Eyes:     Conjunctiva/sclera: Conjunctivae normal.  Cardiovascular:     Rate and Rhythm: Normal rate and regular rhythm.     Heart sounds: No murmur heard. Pulmonary:     Effort: Pulmonary effort is normal. No respiratory distress.      Breath sounds: Normal breath sounds.     Comments: Anterior chest wall tender to palpation on the right CTAB Abdominal:     Palpations: Abdomen is soft.     Tenderness: There is no abdominal tenderness.  Musculoskeletal:        General: Normal range of motion.     Cervical back: Neck supple.  Skin:    General: Skin is warm and dry.     Comments: No rash  Neurological:     Mental Status: She is alert and oriented to person, place, and time.  Psychiatric:        Mood and Affect: Mood normal.        Behavior: Behavior normal.    ED Results / Procedures / Treatments   Labs (all labs ordered are listed, but only abnormal results are displayed) Labs Reviewed  BASIC METABOLIC PANEL - Abnormal; Notable for the following components:      Result Value   Glucose, Bld 112 (*)    All other components within normal limits  CBC  D-DIMER, QUANTITATIVE  I-STAT BETA HCG BLOOD, ED ([facility], [facility], [facility] ONLY)  TROPONIN I (HIGH SENSITIVITY)  TROPONIN I (HIGH SENSITIVITY)    EKG EKG Interpretation  Date/Time:  Wednesday January 14 2021 19:48:24 EDT Ventricular Rate:  76 PR Interval:  172 QRS Duration: 97 QT Interval:  383 QTC Calculation: 431 R Axis:   62 Text Interpretation: Sinus rhythm Probable left atrial enlargement Low voltage, precordial leads Baseline wander in lead(s) V5 Confirmed by Regan Lemming ([id number]) on 01/14/2021 7:51:50 PM  Radiology DG Chest 2 View  Result Date: 01/14/2021 CLINICAL DATA:  Chest pain EXAM: CHEST - 2 VIEW COMPARISON:  None. FINDINGS: The heart and mediastinal contours are within normal limits. No focal consolidation. No pulmonary edema. No pleural effusion. No pneumothorax. No acute osseous abnormality. IMPRESSION: No active cardiopulmonary disease. Electronically Signed   By: Iven Finn M.D.   On: 01/14/2021 19:58    Procedures Procedures   Medications Ordered in ED Medications - No data to display  ED Course  I have reviewed the triage vital signs and  the nursing notes.  Pertinent labs & imaging results that were available during  my care of the patient were reviewed by me and considered in my medical decision making (see chart for details).    MDM Rules/Calculators/A&P                           Patient here with right-sided chest pain x3 weeks.  Patient is in no respiratory distress.  EKG shows no ischemic changes.  Troponin is less than 2.  Normal CBC and BMP.  Pregnancy test negative.  Will add on D-dimer.  If D-dimer is negative, I suspect the patient can likely be safely discharged.  Patient's symptoms do seem to be reproducible.  Consider treatment with anti-inflammatory.  D-dimer and repeat troponin are negative, doubt ACS or PE.  Given easily reproducible pain, will treat for chest wall inflammation.  Recommend PCP follow-up. Final Clinical Impression(s) / ED Diagnoses Final diagnoses:  Precordial pain    Rx / DC Orders ED Discharge Orders          Ordered    cyclobenzaprine (FLEXERIL) 10 MG tablet  2 times daily PRN        01/15/21 0200             Montine Circle, PA-C 00/97/94 9971    Delora Fuel, MD 82/09/90 [phone]

## 2021-01-15 LAB — TROPONIN I (HIGH SENSITIVITY): Troponin I (High Sensitivity): <2 ng/L (ref <18)

## 2021-01-15 LAB — D-DIMER, QUANTITATIVE: D-Dimer, Quant: 0.45 ug/mL-FEU (ref 0.00–0.50)

## 2021-01-15 MED ORDER — CYCLOBENZAPRINE HCL 10 MG PO TABS: 10.0000 mg | ORAL_TABLET | Freq: Two times a day (BID) | ORAL | 0 refills | Status: AC | PRN

## 2021-01-15 NOTE — Discharge Instructions (Addendum)
Its unclear what the cause of your pain is.  Please take the medications as prescribed.  You can also take ibuprofen, apply ice or heat.  Please follow-up with your regular doctor.  If your symptoms change or worsen, please return to the emergency department.

## 2021-05-29 DIAGNOSIS — Z1231 Encounter for screening mammogram for malignant neoplasm of breast: Secondary | ICD-10-CM | POA: Diagnosis not present

## 2021-06-12 DIAGNOSIS — N6011 Diffuse cystic mastopathy of right breast: Secondary | ICD-10-CM | POA: Diagnosis not present

## 2021-06-12 DIAGNOSIS — R928 Other abnormal and inconclusive findings on diagnostic imaging of breast: Secondary | ICD-10-CM | POA: Diagnosis not present

## 2021-06-12 DIAGNOSIS — Z803 Family history of malignant neoplasm of breast: Secondary | ICD-10-CM | POA: Diagnosis not present

## 2021-07-17 DIAGNOSIS — H109 Unspecified conjunctivitis: Secondary | ICD-10-CM | POA: Diagnosis not present

## 2021-07-17 DIAGNOSIS — J019 Acute sinusitis, unspecified: Secondary | ICD-10-CM | POA: Diagnosis not present

## 2021-07-17 DIAGNOSIS — J040 Acute laryngitis: Secondary | ICD-10-CM | POA: Diagnosis not present

## 2021-07-17 DIAGNOSIS — B9689 Other specified bacterial agents as the cause of diseases classified elsewhere: Secondary | ICD-10-CM | POA: Diagnosis not present

## 2022-02-02 ENCOUNTER — Telehealth: Payer: Self-pay | Admitting: *Deleted

## 2022-02-02 NOTE — Telephone Encounter (Signed)
Patient was transferred to my phone. However, patient is calling regarding her new patient appointment with Dr Marius Ditch on 03/24/2022 and nothing regarding a colonoscopy.  Patient would like to be seen sooner due to her symptoms.   Please call patient back.

## 2022-03-24 ENCOUNTER — Ambulatory Visit (INDEPENDENT_AMBULATORY_CARE_PROVIDER_SITE_OTHER): Payer: No Typology Code available for payment source | Admitting: Gastroenterology

## 2022-03-24 ENCOUNTER — Other Ambulatory Visit: Payer: Self-pay

## 2022-03-24 ENCOUNTER — Encounter: Payer: Self-pay | Admitting: Gastroenterology

## 2022-03-24 VITALS — BP 132/85 | HR 63 | Temp 98.5°F | Ht 67.0 in | Wt 189.2 lb

## 2022-03-24 DIAGNOSIS — D509 Iron deficiency anemia, unspecified: Secondary | ICD-10-CM

## 2022-03-24 DIAGNOSIS — K529 Noninfective gastroenteritis and colitis, unspecified: Secondary | ICD-10-CM | POA: Diagnosis not present

## 2022-03-24 DIAGNOSIS — R634 Abnormal weight loss: Secondary | ICD-10-CM | POA: Diagnosis not present

## 2022-03-24 DIAGNOSIS — R635 Abnormal weight gain: Secondary | ICD-10-CM

## 2022-03-24 MED ORDER — ONDANSETRON HCL 4 MG PO TABS: 4.0000 mg | ORAL_TABLET | Freq: Three times a day (TID) | ORAL | 0 refills | Status: AC | PRN

## 2022-03-24 NOTE — Progress Notes (Signed)
Cephas Darby, MD [address]  [place]  [place], [place] [address]  Main: [phone]  Fax: [phone]    Gastroenterology Consultation  Referring Provider:     Clinic, [facility] Primary Care Physician:  No primary care provider on file. Primary Gastroenterologist:  Dr. Cephas Darby Reason for Consultation: Chronic diarrhea, unexplained weight loss        HPI:   Brittany Vasquez is a 41 y.o. female referred by the [facility] for consultation & management of chronic diarrhea and unexplained weight loss.  Chronic diarrhea and unexplained weight loss.  Patient reports that she had remote history of ulcerative colitis for which she was treated with prednisone and the diagnosis was revisited and she was informed that she has irritable bowel syndrome.  Her symptoms are mostly diarrhea.  Since summer of this year, she started experiencing several watery bowel movements with abdominal cramps, and she lost about 20 pounds in 2 weeks.  Her stool studies came back positive for E. coli for which she was treated.  She is also found to have mild iron deficiency anemia.  Her diarrhea is persistent.  Her weight loss has stabilized.  She denies any abdominal pain, nausea or vomiting.  She does report decreased p.o. intake.  She is consuming edibles to keep her appetite intact.  She has mild anemia with severe iron deficiency.  She does not smoke tobacco or drink alcohol She denies any family history of GI malignancy  NSAIDs: None  Antiplts/Anticoagulants/Anti thrombotics: None  GI Procedures: Underwent colonoscopy several years ago  Past Medical History:  Diagnosis Date   Anemia    Anxiety    Depression    doing ok   Family history of breast cancer    Fibromyalgia    Mass of perirectal soft tissue - prob seb cyst 05/22/2012   Rheumatoid arthritis ([place])    Ulcerative colitis ([place])     Past Surgical History:  Procedure Laterality Date   CESAREAN SECTION  01/27/2012    Procedure: CESAREAN SECTION;  Surgeon: Thurnell Lose, MD;  Location: [facility];  Service: Obstetrics;  Laterality: N/A;  Primary Cesarean Section with birth of baby boy @ 73     Current Outpatient Medications:    cyclobenzaprine (FLEXERIL) 10 MG tablet, Take 1 tablet (10 mg total) by mouth 2 (two) times daily as needed for muscle spasms., Disp: 20 tablet, Rfl: 0   escitalopram (LEXAPRO) 20 MG tablet, Take 20 mg by mouth daily., Disp: , Rfl:    fluticasone (FLONASE) 50 MCG/ACT nasal spray, Place 2 sprays into both nostrils daily., Disp: , Rfl:    ibuprofen (ADVIL,MOTRIN) 600 MG tablet, Take 1 tablet (600 mg total) by mouth every 6 (six) hours as needed., Disp: 30 tablet, Rfl: 0   montelukast (SINGULAIR) 10 MG tablet, Take 10 mg by mouth at bedtime., Disp: , Rfl:    ondansetron (ZOFRAN) 4 MG tablet, Take 1 tablet (4 mg total) by mouth every 8 (eight) hours as needed for nausea or vomiting., Disp: 30 tablet, Rfl: 0   Family History  Problem Relation Age of Onset   Breast cancer Mother 85       breast cancer returned again at age 6   Hypertension Mother    Brain cancer Maternal Aunt 18   Cancer Maternal Aunt        uterine vs ovarian   Cancer Maternal Aunt        uterine vs ovarian   Cancer Maternal  Aunt        uterine vs ovariant   Colon cancer Maternal Uncle    Breast cancer Maternal Grandmother 80   Stroke Paternal Grandfather      Social History   Tobacco Use   Smoking status: Never   Smokeless tobacco: Never  Vaping Use   Vaping Use: Never used  Substance Use Topics   Alcohol use: No   Drug use: No    Allergies as of 03/24/2022   (No Known Allergies)    Review of Systems:    All systems reviewed and negative except where noted in HPI.   Physical Exam:  BP 132/85 (BP Location: Right Arm, Patient Position: Sitting, Cuff Size: Normal)   Pulse 63   Temp 98.5 F (36.9 C)   Ht '5\' 7"'$  (1.702 m)   Wt 189 lb 4 oz (85.8 kg)   BMI 29.64 kg/m  No LMP  recorded.  General:   Alert,  Well-developed, well-nourished, pleasant and cooperative in NAD Head:  Normocephalic and atraumatic. Eyes:  Sclera clear, no icterus.   Conjunctiva pink. Ears:  Normal auditory acuity. Nose:  No deformity, discharge, or lesions. Mouth:  No deformity or lesions,oropharynx pink & moist. Neck:  Supple; no masses or thyromegaly. Lungs:  Respirations even and unlabored.  Clear throughout to auscultation.   No wheezes, crackles, or rhonchi. No acute distress. Heart:  Regular rate and rhythm; no murmurs, clicks, rubs, or gallops. Abdomen:  Normal bowel sounds. Soft, non-tender and non-distended without masses, hepatosplenomegaly or hernias noted.  No guarding or rebound tenderness.   Rectal: Not performed Msk:  Symmetrical without gross deformities. Good, equal movement & strength bilaterally. Pulses:  Normal pulses noted. Extremities:  No clubbing or edema.  No cyanosis. Neurologic:  Alert and oriented x3;  grossly normal neurologically. Skin:  Intact without significant lesions or rashes. No jaundice. Psych:  Alert and cooperative. Normal mood and affect.  Imaging Studies: No abdominal imaging  Assessment and Plan:   Brittany Vasquez is a 40 y.o. pleasant African-American female with remote history of ulcerative colitis, not on any maintenance therapy, rediagnosed with irritable bowel syndrome now presents with approximately 6 months history of chronic nonbloody diarrhea, temporarily weight loss.  History of E. coli infection s/p treatment.  Also, has mild iron deficiency anemia With ongoing diarrhea, recommend repeat GI profile PCR to rule out any infection including C. difficile Recommend upper endoscopy as well as colonoscopy with possible TI evaluation and random colon biopsies  I have discussed alternative options, risks & benefits,  which include, but are not limited to, bleeding, infection, perforation,respiratory complication & drug reaction.  The  patient agrees with this plan & written consent will be obtained.     Follow up after the above workup   Cephas Darby, MD

## 2022-03-25 ENCOUNTER — Encounter: Payer: Self-pay | Admitting: Gastroenterology

## 2022-03-25 DIAGNOSIS — N926 Irregular menstruation, unspecified: Secondary | ICD-10-CM | POA: Insufficient documentation

## 2022-03-25 DIAGNOSIS — H04123 Dry eye syndrome of bilateral lacrimal glands: Secondary | ICD-10-CM | POA: Insufficient documentation

## 2022-03-25 DIAGNOSIS — Z6379 Other stressful life events affecting family and household: Secondary | ICD-10-CM | POA: Insufficient documentation

## 2022-03-25 DIAGNOSIS — E559 Vitamin D deficiency, unspecified: Secondary | ICD-10-CM | POA: Insufficient documentation

## 2022-03-25 DIAGNOSIS — M25521 Pain in right elbow: Secondary | ICD-10-CM | POA: Insufficient documentation

## 2022-03-25 DIAGNOSIS — M542 Cervicalgia: Secondary | ICD-10-CM | POA: Insufficient documentation

## 2022-03-25 DIAGNOSIS — Z638 Other specified problems related to primary support group: Secondary | ICD-10-CM | POA: Insufficient documentation

## 2022-03-25 DIAGNOSIS — N760 Acute vaginitis: Secondary | ICD-10-CM | POA: Insufficient documentation

## 2022-03-25 DIAGNOSIS — M7711 Lateral epicondylitis, right elbow: Secondary | ICD-10-CM | POA: Insufficient documentation

## 2022-03-25 DIAGNOSIS — G5600 Carpal tunnel syndrome, unspecified upper limb: Secondary | ICD-10-CM | POA: Insufficient documentation

## 2022-03-25 DIAGNOSIS — K5190 Ulcerative colitis, unspecified, without complications: Secondary | ICD-10-CM | POA: Insufficient documentation

## 2022-03-25 DIAGNOSIS — F411 Generalized anxiety disorder: Secondary | ICD-10-CM | POA: Insufficient documentation

## 2022-03-25 DIAGNOSIS — M790 Rheumatism, unspecified: Secondary | ICD-10-CM | POA: Insufficient documentation

## 2022-03-25 DIAGNOSIS — F439 Reaction to severe stress, unspecified: Secondary | ICD-10-CM | POA: Insufficient documentation

## 2022-03-25 DIAGNOSIS — M722 Plantar fascial fibromatosis: Secondary | ICD-10-CM | POA: Insufficient documentation

## 2022-03-25 DIAGNOSIS — R768 Other specified abnormal immunological findings in serum: Secondary | ICD-10-CM | POA: Insufficient documentation

## 2022-03-25 DIAGNOSIS — K429 Umbilical hernia without obstruction or gangrene: Secondary | ICD-10-CM | POA: Insufficient documentation

## 2022-03-25 DIAGNOSIS — M7918 Myalgia, other site: Secondary | ICD-10-CM | POA: Insufficient documentation

## 2022-03-25 DIAGNOSIS — R160 Hepatomegaly, not elsewhere classified: Secondary | ICD-10-CM | POA: Insufficient documentation

## 2022-03-25 DIAGNOSIS — N3941 Urge incontinence: Secondary | ICD-10-CM | POA: Insufficient documentation

## 2022-03-25 DIAGNOSIS — M25561 Pain in right knee: Secondary | ICD-10-CM | POA: Insufficient documentation

## 2022-03-25 DIAGNOSIS — R634 Abnormal weight loss: Secondary | ICD-10-CM | POA: Insufficient documentation

## 2022-03-25 DIAGNOSIS — J351 Hypertrophy of tonsils: Secondary | ICD-10-CM | POA: Insufficient documentation

## 2022-03-29 LAB — GI PROFILE, STOOL, PCR

## 2022-03-29 LAB — SPECIMEN STATUS REPORT

## 2022-04-06 ENCOUNTER — Ambulatory Visit
Admission: RE | Admit: 2022-04-06 | Discharge: 2022-04-06 | Disposition: A | Discharge status: Home / Self Care | Payer: No Typology Code available for payment source | Source: Ambulatory Visit | Attending: Gastroenterology | Admitting: Gastroenterology

## 2022-04-06 ENCOUNTER — Encounter: Payer: Self-pay | Admitting: Gastroenterology

## 2022-04-06 ENCOUNTER — Other Ambulatory Visit: Payer: Self-pay

## 2022-04-06 ENCOUNTER — Ambulatory Visit: Payer: No Typology Code available for payment source | Admitting: Certified Registered"

## 2022-04-06 ENCOUNTER — Encounter
Admission: RE | Disposition: A | Discharge status: Home / Self Care | Payer: Self-pay | Source: Ambulatory Visit | Attending: Gastroenterology

## 2022-04-06 DIAGNOSIS — M797 Fibromyalgia: Secondary | ICD-10-CM | POA: Diagnosis not present

## 2022-04-06 DIAGNOSIS — Z6828 Body mass index (BMI) 28.0-28.9, adult: Secondary | ICD-10-CM | POA: Insufficient documentation

## 2022-04-06 DIAGNOSIS — R634 Abnormal weight loss: Secondary | ICD-10-CM | POA: Diagnosis not present

## 2022-04-06 DIAGNOSIS — F32A Depression, unspecified: Secondary | ICD-10-CM | POA: Insufficient documentation

## 2022-04-06 DIAGNOSIS — K529 Noninfective gastroenteritis and colitis, unspecified: Secondary | ICD-10-CM

## 2022-04-06 DIAGNOSIS — F419 Anxiety disorder, unspecified: Secondary | ICD-10-CM | POA: Insufficient documentation

## 2022-04-06 HISTORY — PX: COLONOSCOPY WITH PROPOFOL: SHX5780

## 2022-04-06 HISTORY — PX: ESOPHAGOGASTRODUODENOSCOPY (EGD) WITH PROPOFOL: SHX5813

## 2022-04-06 SURGERY — COLONOSCOPY WITH PROPOFOL
Anesthesia: General

## 2022-04-06 MED ORDER — LIDOCAINE HCL (CARDIAC) PF 100 MG/5ML IV SOSY
PREFILLED_SYRINGE | INTRAVENOUS | Status: DC | PRN
  Administered 2022-04-06: 60 mg via INTRAVENOUS

## 2022-04-06 MED ORDER — PROPOFOL 500 MG/50ML IV EMUL
INTRAVENOUS | Status: DC | PRN
  Administered 2022-04-06: 50 mg via INTRAVENOUS

## 2022-04-06 MED ORDER — SODIUM CHLORIDE 0.9 % IV SOLN: INTRAVENOUS | Status: DC

## 2022-04-06 MED ORDER — PROPOFOL 10 MG/ML IV BOLUS
INTRAVENOUS | Status: DC | PRN
  Administered 2022-04-06: 200 ug/kg/min via INTRAVENOUS

## 2022-04-06 MED ORDER — DEXMEDETOMIDINE HCL IN NACL 200 MCG/50ML IV SOLN
INTRAVENOUS | Status: DC | PRN
  Administered 2022-04-06: 12 ug via INTRAVENOUS

## 2022-04-06 NOTE — Anesthesia Postprocedure Evaluation (Signed)
Anesthesia Post Note  Patient: Brittany Vasquez  Procedure(s) Performed: COLONOSCOPY WITH PROPOFOL ESOPHAGOGASTRODUODENOSCOPY (EGD) WITH PROPOFOL  Patient location during evaluation: Endoscopy Anesthesia Type: General Level of consciousness: awake and alert Pain management: pain level controlled Vital Signs Assessment: post-procedure vital signs reviewed and stable Respiratory status: spontaneous breathing, nonlabored ventilation, respiratory function stable and patient connected to nasal cannula oxygen Cardiovascular status: blood pressure returned to baseline and stable Postop Assessment: no apparent nausea or vomiting Anesthetic complications: no  There were no known notable events for this encounter.   Last Vitals:  Vitals:   04/06/22 1051 04/06/22 1101  BP: 132/76 (!) 132/90  Pulse: 74 64  Resp: 16   Temp:    SpO2: 100% 100%    Last Pain:  Vitals:   04/06/22 1101  TempSrc:   PainSc: 0-No pain                 Dimas Millin

## 2022-04-06 NOTE — Anesthesia Preprocedure Evaluation (Signed)
Anesthesia Evaluation  Patient identified by MRN, date of birth, ID band Patient awake    Reviewed: Allergy & Precautions, NPO status , Patient's Chart, lab work & pertinent test results  History of Anesthesia Complications Negative for: history of anesthetic complications  Airway Mallampati: III  TM Distance: >3 FB Neck ROM: full    Dental  (+) Chipped   Pulmonary neg pulmonary ROS, neg shortness of breath, neg COPD   Pulmonary exam normal        Cardiovascular (-) angina (-) Past MI and (-) CABG negative cardio ROS Normal cardiovascular exam     Neuro/Psych  PSYCHIATRIC DISORDERS      negative neurological ROS     GI/Hepatic negative GI ROS, Neg liver ROS,,,  Endo/Other  negative endocrine ROS    Renal/GU negative Renal ROS  negative genitourinary   Musculoskeletal   Abdominal   Peds  Hematology negative hematology ROS (+)   Anesthesia Other Findings Past Medical History: No date: Anemia No date: Anxiety No date: Depression     Comment:  doing ok No date: Family history of breast cancer No date: Fibromyalgia 05/22/2012: Mass of perirectal soft tissue - prob seb cyst No date: Rheumatoid arthritis ([place]) No date: Ulcerative colitis [facility])  Past Surgical History: 01/27/2012: CESAREAN SECTION     Comment:  Procedure: CESAREAN SECTION;  Surgeon: Thurnell Lose,               MD;  Location: [facility];  Service: Obstetrics;  Laterality:              N/A;  Primary Cesarean Section with birth of baby boy @               1907  BMI    Body Mass Index: 28.66 kg/m      Reproductive/Obstetrics negative OB ROS                             Anesthesia Physical Anesthesia Plan  ASA: 2  Anesthesia Plan: General   Post-op Pain Management: Minimal or no pain anticipated   Induction: Intravenous  PONV Risk Score and Plan: 3 and Propofol infusion, TIVA and Ondansetron  Airway Management  Planned: Nasal Cannula  Additional Equipment: None  Intra-op Plan:   Post-operative Plan:   Informed Consent: I have reviewed the patients History and Physical, chart, labs and discussed the procedure including the risks, benefits and alternatives for the proposed anesthesia with the patient or authorized representative who has indicated his/her understanding and acceptance.     Dental advisory given  Plan Discussed with: CRNA and Surgeon  Anesthesia Plan Comments: (Discussed risks of anesthesia with patient, including possibility of difficulty with spontaneous ventilation under anesthesia necessitating airway intervention, PONV, and rare risks such as cardiac or respiratory or neurological events, and allergic reactions. Discussed the role of CRNA in patient's perioperative care. Patient understands.)       Anesthesia Quick Evaluation

## 2022-04-06 NOTE — Op Note (Signed)
[facility] Gastroenterology Patient Name: Brittany Vasquez Procedure Date: 04/06/2022 9:50 AM MRN: [id number] Account #: [id number] Date of Birth: 12-Feb-1981 Admit Type: Outpatient Age: 42 Room: [facility] ENDO ROOM 3 Gender: Female Note Status: Finalized Instrument Name: Park Meo [id number] Procedure:             Colonoscopy Indications:           Chronic diarrhea, Weight loss Providers:             Lin Landsman MD, MD Referring MD:          none Medicines:             General Anesthesia Complications:         No immediate complications. Estimated blood loss: None. Procedure:             Pre-Anesthesia Assessment:                        - Prior to the procedure, a History and Physical was                         performed, and patient medications and allergies were                         reviewed. The patient is competent. The risks and                         benefits of the procedure and the sedation options and                         risks were discussed with the patient. All questions                         were answered and informed consent was obtained.                         Patient identification and proposed procedure were                         verified by the physician, the nurse, the                         anesthesiologist, the anesthetist and the technician                         in the pre-procedure area in the procedure room in the                         endoscopy suite. Mental Status Examination: alert and                         oriented. Airway Examination: normal oropharyngeal                         airway and neck mobility. Respiratory Examination:                         clear to auscultation. CV Examination: normal.  Prophylactic Antibiotics: The patient does not require                         prophylactic antibiotics. Prior Anticoagulants: The                         patient has taken no anticoagulant or  antiplatelet                         agents. ASA Grade Assessment: II - A patient with mild                         systemic disease. After reviewing the risks and                         benefits, the patient was deemed in satisfactory                         condition to undergo the procedure. The anesthesia                         plan was to use general anesthesia. Immediately prior                         to administration of medications, the patient was                         re-assessed for adequacy to receive sedatives. The                         heart rate, respiratory rate, oxygen saturations,                         blood pressure, adequacy of pulmonary ventilation, and                         response to care were monitored throughout the                         procedure. The physical status of the patient was                         re-assessed after the procedure.                        After obtaining informed consent, the colonoscope was                         passed under direct vision. Throughout the procedure,                         the patient's blood pressure, pulse, and oxygen                         saturations were monitored continuously. The                         Colonoscope was introduced through the anus and  advanced to the the terminal ileum, with                         identification of the appendiceal orifice and IC                         valve. The colonoscopy was performed without                         difficulty. The patient tolerated the procedure well.                         The quality of the bowel preparation was evaluated                         using the BBPS [facility] Bowel Preparation Scale) with                         scores of: Right Colon = 3, Transverse Colon = 3 and                         Left Colon = 3 (entire mucosa seen well with no                         residual staining, small fragments of stool or  opaque                         liquid). The total BBPS score equals 9. The terminal                         ileum, ileocecal valve, appendiceal orifice, and                         rectum were photographed. Findings:      The perianal and digital rectal examinations were normal. Pertinent       negatives include normal sphincter tone and no palpable rectal lesions.      The terminal ileum appeared normal.      The entire examined colon appeared normal. Biopsies for histology were       taken with a cold forceps from the entire colon for evaluation of       microscopic colitis.      The retroflexed view of the distal rectum and anal verge was normal and       showed no anal or rectal abnormalities. Impression:            - The examined portion of the ileum was normal.                        - The entire examined colon is normal. Biopsied.                        - The distal rectum and anal verge are normal on                         retroflexion view. Recommendation:        - Discharge patient to home (with escort).                        -  Resume previous diet today.                        - Continue present medications.                        - Await pathology results. Procedure Code(s):     --- Professional ---                        [phone], Colonoscopy, flexible; with biopsy, single or                         multiple Diagnosis Code(s):     --- Professional ---                        K52.9, Noninfective gastroenteritis and colitis,                         unspecified                        R63.4, Abnormal weight loss CPT copyright 2022 American Medical Association. All rights reserved. The codes documented in this report are preliminary and upon coder review may  be revised to meet current compliance requirements. Dr. Ulyess Mort Lin Landsman MD, MD 04/06/2022 10:30:24 AM This report has been signed electronically. Number of Addenda: 0 Note Initiated On: 04/06/2022 9:50  AM Scope Withdrawal Time: 0 hours 13 minutes 32 seconds  Total Procedure Duration: 0 hours 17 minutes 24 seconds  Estimated Blood Loss:  Estimated blood loss: none.      [facility]

## 2022-04-06 NOTE — OR Nursing (Signed)
POC urine pregrancy test result negative.

## 2022-04-06 NOTE — Op Note (Signed)
[facility] Gastroenterology Patient Name: Brittany Vasquez Procedure Date: 04/06/2022 9:50 AM MRN: [id number] Account #: [id number] Date of Birth: 1980-09-08 Admit Type: Outpatient Age: 42 Room: [facility] ENDO ROOM 3 Gender: Female Note Status: Finalized Instrument Name: Upper Endoscope [id number] Procedure:             Upper GI endoscopy Indications:           Diarrhea, Weight loss Providers:             Lin Landsman MD, MD Referring MD:          none Medicines:             General Anesthesia Complications:         No immediate complications. Estimated blood loss: None. Procedure:             Pre-Anesthesia Assessment:                        - Prior to the procedure, a History and Physical was                         performed, and patient medications and allergies were                         reviewed. The patient is competent. The risks and                         benefits of the procedure and the sedation options and                         risks were discussed with the patient. All questions                         were answered and informed consent was obtained.                         Patient identification and proposed procedure were                         verified by the physician, the nurse, the                         anesthesiologist, the anesthetist and the technician                         in the pre-procedure area in the procedure room in the                         endoscopy suite. Mental Status Examination: alert and                         oriented. Airway Examination: normal oropharyngeal                         airway and neck mobility. Respiratory Examination:                         clear to auscultation. CV Examination: normal.  Prophylactic Antibiotics: The patient does not require                         prophylactic antibiotics. Prior Anticoagulants: The                         patient has taken no anticoagulant or  antiplatelet                         agents. ASA Grade Assessment: II - A patient with mild                         systemic disease. After reviewing the risks and                         benefits, the patient was deemed in satisfactory                         condition to undergo the procedure. The anesthesia                         plan was to use general anesthesia. Immediately prior                         to administration of medications, the patient was                         re-assessed for adequacy to receive sedatives. The                         heart rate, respiratory rate, oxygen saturations,                         blood pressure, adequacy of pulmonary ventilation, and                         response to care were monitored throughout the                         procedure. The physical status of the patient was                         re-assessed after the procedure.                        After obtaining informed consent, the endoscope was                         passed under direct vision. Throughout the procedure,                         the patient's blood pressure, pulse, and oxygen                         saturations were monitored continuously. The Endoscope                         was introduced through the mouth, and advanced to the  third part of duodenum. The upper GI endoscopy was                         accomplished without difficulty. The patient tolerated                         the procedure well. Findings:      The duodenal bulb and examined duodenum were normal. Biopsies were taken       with a cold forceps for histology.      The entire examined stomach was normal. Biopsies were taken with a cold       forceps for Helicobacter pylori testing.      The cardia and gastric fundus were normal on retroflexion.      Esophagogastric landmarks were identified: the gastroesophageal junction       was found at 40 cm from the incisors.       The gastroesophageal junction and examined esophagus were normal. Impression:            - Normal duodenal bulb and examined duodenum. Biopsied.                        - Normal stomach. Biopsied.                        - Esophagogastric landmarks identified.                        - Normal gastroesophageal junction and esophagus. Recommendation:        - Await pathology results.                        - Proceed with colonoscopy as scheduled                        See colonoscopy report Procedure Code(s):     --- Professional ---                        [phone], Esophagogastroduodenoscopy, flexible,                         transoral; with biopsy, single or multiple Diagnosis Code(s):     --- Professional ---                        R19.7, Diarrhea, unspecified                        R63.4, Abnormal weight loss CPT copyright 2022 American Medical Association. All rights reserved. The codes documented in this report are preliminary and upon coder review may  be revised to meet current compliance requirements. Dr. Ulyess Mort Lin Landsman MD, MD 04/06/2022 10:09:44 AM This report has been signed electronically. Number of Addenda: 0 Note Initiated On: 04/06/2022 9:50 AM Estimated Blood Loss:  Estimated blood loss: none.      [facility]

## 2022-04-06 NOTE — H&P (Signed)
Brittany Darby, MD [address]  [place]  [place], [place] [address]  Main: [phone]  Fax: [phone] Pager: [phone]  Primary Care Physician:  Zachery Dauer, MD Primary Gastroenterologist:  Dr. Cephas Vasquez  Pre-Procedure History & Physical: HPI:  Brittany Vasquez is a 42 y.o. female is here for an endoscopy and colonoscopy.   Past Medical History:  Diagnosis Date   Anemia    Anxiety    Depression    doing ok   Family history of breast cancer    Fibromyalgia    Mass of perirectal soft tissue - prob seb cyst 05/22/2012   Rheumatoid arthritis ([facility])    Ulcerative colitis ([facility])     Past Surgical History:  Procedure Laterality Date   CESAREAN SECTION  01/27/2012   Procedure: CESAREAN SECTION;  Surgeon: Thurnell Lose, MD;  Location: [facility];  Service: Obstetrics;  Laterality: N/A;  Primary Cesarean Section with birth of baby boy @ 1907    Prior to Admission medications   Medication Sig Start Date End Date Taking? Authorizing Provider  cyclobenzaprine (FLEXERIL) 10 MG tablet Take 1 tablet (10 mg total) by mouth 2 (two) times daily as needed for muscle spasms. 01/15/21   Montine Circle, PA-C  escitalopram (LEXAPRO) 20 MG tablet Take 20 mg by mouth daily. 07/27/21   [provider]  fluticasone (FLONASE) 50 MCG/ACT nasal spray Place 2 sprays into both nostrils daily. 07/17/21   [provider]  ibuprofen (ADVIL,MOTRIN) 600 MG tablet Take 1 tablet (600 mg total) by mouth every 6 (six) hours as needed. 02/16/18   Myrtis Ser, CNM  montelukast (SINGULAIR) 10 MG tablet Take 10 mg by mouth at bedtime. 06/12/21   [provider]  ondansetron (ZOFRAN) 4 MG tablet Take 1 tablet (4 mg total) by mouth every 8 (eight) hours as needed for nausea or vomiting. 03/24/22   Lin Landsman, MD    Allergies as of 03/24/2022   (No Known Allergies)    Family History  Problem Relation Age of Onset   Breast cancer Mother 56       breast  cancer returned again at age 73   Hypertension Mother    Brain cancer Maternal Aunt 39   Cancer Maternal Aunt        uterine vs ovarian   Cancer Maternal Aunt        uterine vs ovarian   Cancer Maternal Aunt        uterine vs ovariant   Colon cancer Maternal Uncle    Breast cancer Maternal Grandmother 80   Stroke Paternal Grandfather     Social History   Socioeconomic History   Marital status: Married    Spouse name: Not on file   Number of children: 2   Years of education: Not on file   Highest education level: Not on file  Occupational History   Not on file  Tobacco Use   Smoking status: Never   Smokeless tobacco: Never  Vaping Use   Vaping Use: Never used  Substance and Sexual Activity   Alcohol use: No   Drug use: Yes    Types: Marijuana    Comment: gummies   Sexual activity: Yes    Birth control/protection: None  Other Topics Concern   Not on file  Social History Narrative   Patient lives at home with her Fiance. Patient has a B.S. Degree and has 2 children. Patient denies any tobacco, alcohol, and illicit drug use.  Social Determinants of Health   Financial Resource Strain: Not on file  Food Insecurity: Not on file  Transportation Needs: Not on file  Physical Activity: Not on file  Stress: Not on file  Social Connections: Not on file  Intimate Partner Violence: Not on file    Review of Systems: See HPI, otherwise negative ROS  Physical Exam: BP (!) 110/91   Pulse (!) 101   Temp (!) 96.8 F (36 C) (Temporal)   Resp 20   Ht '5\' 7"'$  (1.702 m)   Wt 83 kg   SpO2 100%   BMI 28.66 kg/m  General:   Alert,  pleasant and cooperative in NAD Head:  Normocephalic and atraumatic. Neck:  Supple; no masses or thyromegaly. Lungs:  Clear throughout to auscultation.    Heart:  Regular rate and rhythm. Abdomen:  Soft, nontender and nondistended. Normal bowel sounds, without guarding, and without rebound.   Neurologic:  Alert and  oriented x4;  grossly normal  neurologically.  Impression/Plan: Correne Lalani is here for an endoscopy and colonoscopy to be performed for  Chronic diarrhea, unexplained weight loss   Risks, benefits, limitations, and alternatives regarding  endoscopy and colonoscopy have been reviewed with the patient.  Questions have been answered.  All parties agreeable.   Sherri Sear, MD  04/06/2022, 9:43 AM

## 2022-04-06 NOTE — Transfer of Care (Signed)
Immediate Anesthesia Transfer of Care Note  Patient: Brittany Vasquez  Procedure(s) Performed: COLONOSCOPY WITH PROPOFOL ESOPHAGOGASTRODUODENOSCOPY (EGD) WITH PROPOFOL  Patient Location: PACU  Anesthesia Type:General  Level of Consciousness: drowsy  Airway & Oxygen Therapy: Patient Spontanous Breathing  Post-op Assessment: Report given to RN, Post -op Vital signs reviewed and stable, and Patient moving all extremities  Post vital signs: Reviewed and stable  Last Vitals:  Vitals Value Taken Time  BP    Temp    Pulse 73 04/06/22 1032  Resp 9 04/06/22 1032  SpO2 100 % 04/06/22 1032  Vitals shown include unvalidated device data.  Last Pain:  Vitals:   04/06/22 0926  TempSrc: Temporal  PainSc: 0-No pain         Complications: No notable events documented.

## 2022-04-07 ENCOUNTER — Encounter: Payer: Self-pay | Admitting: Gastroenterology

## 2022-04-07 LAB — SURGICAL PATHOLOGY

## 2022-04-12 ENCOUNTER — Other Ambulatory Visit: Payer: Self-pay

## 2022-04-12 ENCOUNTER — Encounter: Payer: Self-pay | Admitting: Gastroenterology

## 2022-04-12 ENCOUNTER — Telehealth: Payer: Self-pay

## 2022-04-12 DIAGNOSIS — K769 Liver disease, unspecified: Secondary | ICD-10-CM

## 2022-04-12 DIAGNOSIS — R634 Abnormal weight loss: Secondary | ICD-10-CM

## 2022-04-12 DIAGNOSIS — D509 Iron deficiency anemia, unspecified: Secondary | ICD-10-CM

## 2022-04-12 DIAGNOSIS — R932 Abnormal findings on diagnostic imaging of liver and biliary tract: Secondary | ICD-10-CM

## 2022-04-12 NOTE — Telephone Encounter (Addendum)
Got patient schedule for CT scan for 04/20/2022 arrive 1:30pm [facility].  Nothing to eat or drink 4 hours prior. Got patient schedule for Video capsule for 04/29/2022. Informed patient and patient verbalized understanding. Sent instructions to [employer]

## 2022-04-12 NOTE — Telephone Encounter (Signed)
Called and left a message for call back  

## 2022-04-12 NOTE — Telephone Encounter (Signed)
Patient states she is still having no appetite, extreme nausea and diarrhea 3 to 4 times a day that is straight water. She states she would like further testing to find out what she needs to do

## 2022-04-12 NOTE — Telephone Encounter (Signed)
Patient sent mychart message also: Good Day Dr. Marius Ditch. I assume the important info was left out when the [facility] sent over my file. They only noted I had unexplained weight loss. As I stated when i had my visit with you, I have been suffering with extreme naseuea that is debilitating at times throughout the day, multiple episodes of diarrhea (even when i don't eat), and decreased appetite. Since the beginning of this ordeal i have lost a total of 23lbs. Before the [place] [facility] sent me to you they did GI blood workup and stool samples.  All came back normal except I tested positive for Ecoli. This was back in June. I was treated for that but my symptoms still continue today. This situation is affecting every aspect of my life. I am a busy mom to 4 young children. At times my kids are late to school because of my episodes or due to the fact I have to pull over while driving until my naseuea subsides. I wrote all of this to say that I can no longer wait until i continue to lose more weight. I would like more test ran until we can figure out what is causing these issues. If you have any questions please feel free to contact me [phone]. Thanks in advance.   Brittany Vasquez

## 2022-04-12 NOTE — Telephone Encounter (Signed)
-----   Message from Lin Landsman, MD sent at 04/08/2022  9:31 PM EST ----- Please inform patient that the pathology results from upper endoscopy and colonoscopy came back all normal. If she starts losing weight again, she should let us know to proceed with more tests. I would hold off on further tests at this time  RV

## 2022-04-12 NOTE — Telephone Encounter (Signed)
Added message to telephone call

## 2022-04-20 ENCOUNTER — Ambulatory Visit
Admission: RE | Admit: 2022-04-20 | Discharge: 2022-04-20 | Disposition: A | Discharge status: Home / Self Care | Payer: No Typology Code available for payment source | Source: Ambulatory Visit | Attending: Gastroenterology | Admitting: Gastroenterology

## 2022-04-20 DIAGNOSIS — R634 Abnormal weight loss: Secondary | ICD-10-CM

## 2022-04-20 MED ORDER — IOHEXOL 300 MG/ML  SOLN
100.0000 mL | Freq: Once | INTRAMUSCULAR | Status: AC | PRN
  Administered 2022-04-20: 100 mL via INTRAVENOUS

## 2022-04-23 NOTE — Telephone Encounter (Signed)
Order the MRI and called central scheduling and got patient schedule for 05/03/2022 at 9:00am please arrive at 8:30am and nothing to eat or drink 4 hours prior. Called and informed patient of this appointment. She states she might have to reschedule the MRI and gave her the number to reschedule it

## 2022-04-23 NOTE — Addendum Note (Signed)
Addended by: Ulyess Blossom L on: 04/23/2022 10:18 AM   Modules accepted: Orders

## 2022-04-29 ENCOUNTER — Ambulatory Visit
Admission: RE | Admit: 2022-04-29 | Discharge: 2022-04-29 | Disposition: A | Discharge status: Home / Self Care | Payer: No Typology Code available for payment source | Attending: Gastroenterology | Admitting: Gastroenterology

## 2022-04-29 ENCOUNTER — Encounter
Admission: RE | Disposition: A | Discharge status: Home / Self Care | Payer: Self-pay | Source: Home / Self Care | Attending: Gastroenterology

## 2022-04-29 DIAGNOSIS — D509 Iron deficiency anemia, unspecified: Secondary | ICD-10-CM | POA: Insufficient documentation

## 2022-04-29 DIAGNOSIS — K6389 Other specified diseases of intestine: Secondary | ICD-10-CM | POA: Diagnosis not present

## 2022-04-29 DIAGNOSIS — R634 Abnormal weight loss: Secondary | ICD-10-CM | POA: Diagnosis not present

## 2022-04-29 HISTORY — PX: GIVENS CAPSULE STUDY: SHX5432

## 2022-04-29 SURGERY — IMAGING PROCEDURE, GI TRACT, INTRALUMINAL, VIA CAPSULE

## 2022-04-30 ENCOUNTER — Encounter: Payer: Self-pay | Admitting: Gastroenterology

## 2022-05-03 ENCOUNTER — Ambulatory Visit: Payer: No Typology Code available for payment source

## 2022-05-05 ENCOUNTER — Encounter: Payer: Self-pay | Admitting: Gastroenterology

## 2022-05-05 DIAGNOSIS — D509 Iron deficiency anemia, unspecified: Secondary | ICD-10-CM

## 2022-05-11 ENCOUNTER — Other Ambulatory Visit: Payer: Self-pay

## 2022-05-11 DIAGNOSIS — R634 Abnormal weight loss: Secondary | ICD-10-CM

## 2022-05-11 NOTE — Progress Notes (Signed)
Hey Dr. Marius Ditch, we have Brittany Vasquez on our schedule for an MRI Thursday 2/8. Her MRI was cancelled last week because she had not yet passed the capsule from the [facility] Capsule Study. We just spoke with her and she informed us she still has not passed it as of today but was told by the office it appears to be in her lower colon so she will hopefully pass in before Jun 20, 2022. However, we will need to do a KUB on her prior to her MRI 06/20/22 to make sure she has passed the capsule before we put her in the scanner. Are you able to put in the order for this?   Per Dr. Marius Ditch please order a KUB

## 2022-05-13 ENCOUNTER — Ambulatory Visit
Admission: RE | Admit: 2022-05-13 | Discharge: 2022-05-13 | Disposition: A | Discharge status: Home / Self Care | Payer: No Typology Code available for payment source | Source: Ambulatory Visit | Attending: Gastroenterology | Admitting: Gastroenterology

## 2022-05-13 DIAGNOSIS — R932 Abnormal findings on diagnostic imaging of liver and biliary tract: Secondary | ICD-10-CM | POA: Diagnosis not present

## 2022-05-13 DIAGNOSIS — R634 Abnormal weight loss: Secondary | ICD-10-CM | POA: Diagnosis present

## 2022-05-13 DIAGNOSIS — K769 Liver disease, unspecified: Secondary | ICD-10-CM | POA: Diagnosis present

## 2022-05-13 MED ORDER — GADOBUTROL 1 MMOL/ML IV SOLN
8.0000 mL | Freq: Once | INTRAVENOUS | Status: AC | PRN
  Administered 2022-05-13: 8 mL via INTRAVENOUS

## 2022-05-14 ENCOUNTER — Encounter: Payer: Self-pay | Admitting: Gastroenterology

## 2022-05-14 DIAGNOSIS — K529 Noninfective gastroenteritis and colitis, unspecified: Secondary | ICD-10-CM

## 2022-05-14 DIAGNOSIS — R634 Abnormal weight loss: Secondary | ICD-10-CM

## 2022-05-17 MED ORDER — RIFAXIMIN 550 MG PO TABS: 550.0000 mg | ORAL_TABLET | Freq: Three times a day (TID) | ORAL | 0 refills | Status: DC

## 2022-05-18 ENCOUNTER — Telehealth: Payer: Self-pay

## 2022-05-18 MED ORDER — METRONIDAZOLE 250 MG PO TABS: 250.0000 mg | ORAL_TABLET | Freq: Three times a day (TID) | ORAL | 0 refills | Status: AC

## 2022-05-18 NOTE — Telephone Encounter (Signed)
Lets try metronidazole 250 mg 3 times daily for 2 weeks  RV

## 2022-05-18 NOTE — Telephone Encounter (Signed)
Per [place] is not on the drug formulary it said on formulary is For hepatic encephalopathy Lactulose. If for SIBO with out IBS-D Metronidazole alone, Metronidazole plus cephalexin or Bactrim, Augmentin, Neomycin, or Ciprofloxacin.  If for IBS-D with or with out SIBO: psyllium, Tricyclic antidepressant (desipramine, Nortriptyline)

## 2022-05-18 NOTE — Addendum Note (Signed)
Addended by: Ulyess Blossom L on: 05/18/2022 04:36 PM   Modules accepted: Orders

## 2022-05-18 NOTE — Telephone Encounter (Signed)
Sent medication to the pharmacy. Informed patient by [employer]

## 2022-05-21 LAB — ALPHA-GAL PANEL
Allergen Lamb IgE: <0.1 kU/L
Beef IgE: <0.1 kU/L
IgE (Immunoglobulin E), Serum: 65 IU/mL (ref 6–495)
O215-IgE Alpha-Gal: <0.1 kU/L
Pork IgE: <0.1 kU/L

## 2022-05-21 LAB — FOOD ALLERGY PROFILE
Allergen Corn, IgE: <0.1 kU/L
Clam IgE: <0.1 kU/L
Codfish IgE: <0.1 kU/L
Egg White IgE: <0.1 kU/L
Milk IgE: <0.1 kU/L
Peanut IgE: <0.1 kU/L
Scallop IgE: <0.1 kU/L
Sesame Seed IgE: <0.1 kU/L
Shrimp IgE: <0.1 kU/L
Soybean IgE: <0.1 kU/L
Walnut IgE: <0.1 kU/L
Wheat IgE: <0.1 kU/L

## 2023-02-23 ENCOUNTER — Other Ambulatory Visit: Payer: Self-pay | Admitting: Internal Medicine

## 2023-02-23 DIAGNOSIS — Z1231 Encounter for screening mammogram for malignant neoplasm of breast: Secondary | ICD-10-CM

## 2023-03-09 ENCOUNTER — Other Ambulatory Visit: Payer: Self-pay | Admitting: Internal Medicine

## 2023-03-09 DIAGNOSIS — N6320 Unspecified lump in the left breast, unspecified quadrant: Secondary | ICD-10-CM

## 2023-03-09 DIAGNOSIS — N6489 Other specified disorders of breast: Secondary | ICD-10-CM

## 2023-04-01 ENCOUNTER — Other Ambulatory Visit: Payer: Self-pay | Admitting: Professional Counselor

## 2023-04-01 DIAGNOSIS — N6489 Other specified disorders of breast: Secondary | ICD-10-CM

## 2023-04-01 DIAGNOSIS — N6320 Unspecified lump in the left breast, unspecified quadrant: Secondary | ICD-10-CM

## 2023-04-08 ENCOUNTER — Ambulatory Visit
Admission: RE | Admit: 2023-04-08 | Discharge: 2023-04-08 | Disposition: A | Discharge status: Home / Self Care | Payer: No Typology Code available for payment source | Source: Ambulatory Visit | Attending: Internal Medicine | Admitting: Internal Medicine

## 2023-04-08 ENCOUNTER — Other Ambulatory Visit: Payer: No Typology Code available for payment source

## 2023-04-08 DIAGNOSIS — N6489 Other specified disorders of breast: Secondary | ICD-10-CM

## 2023-04-13 IMAGING — CR DG CHEST 2V
2 series · 2 of 2 positions shown · non-contrast
Comparison: None.

CLINICAL DATA: Chest pain

EXAM:
CHEST - 2 VIEW

[w chest pa]
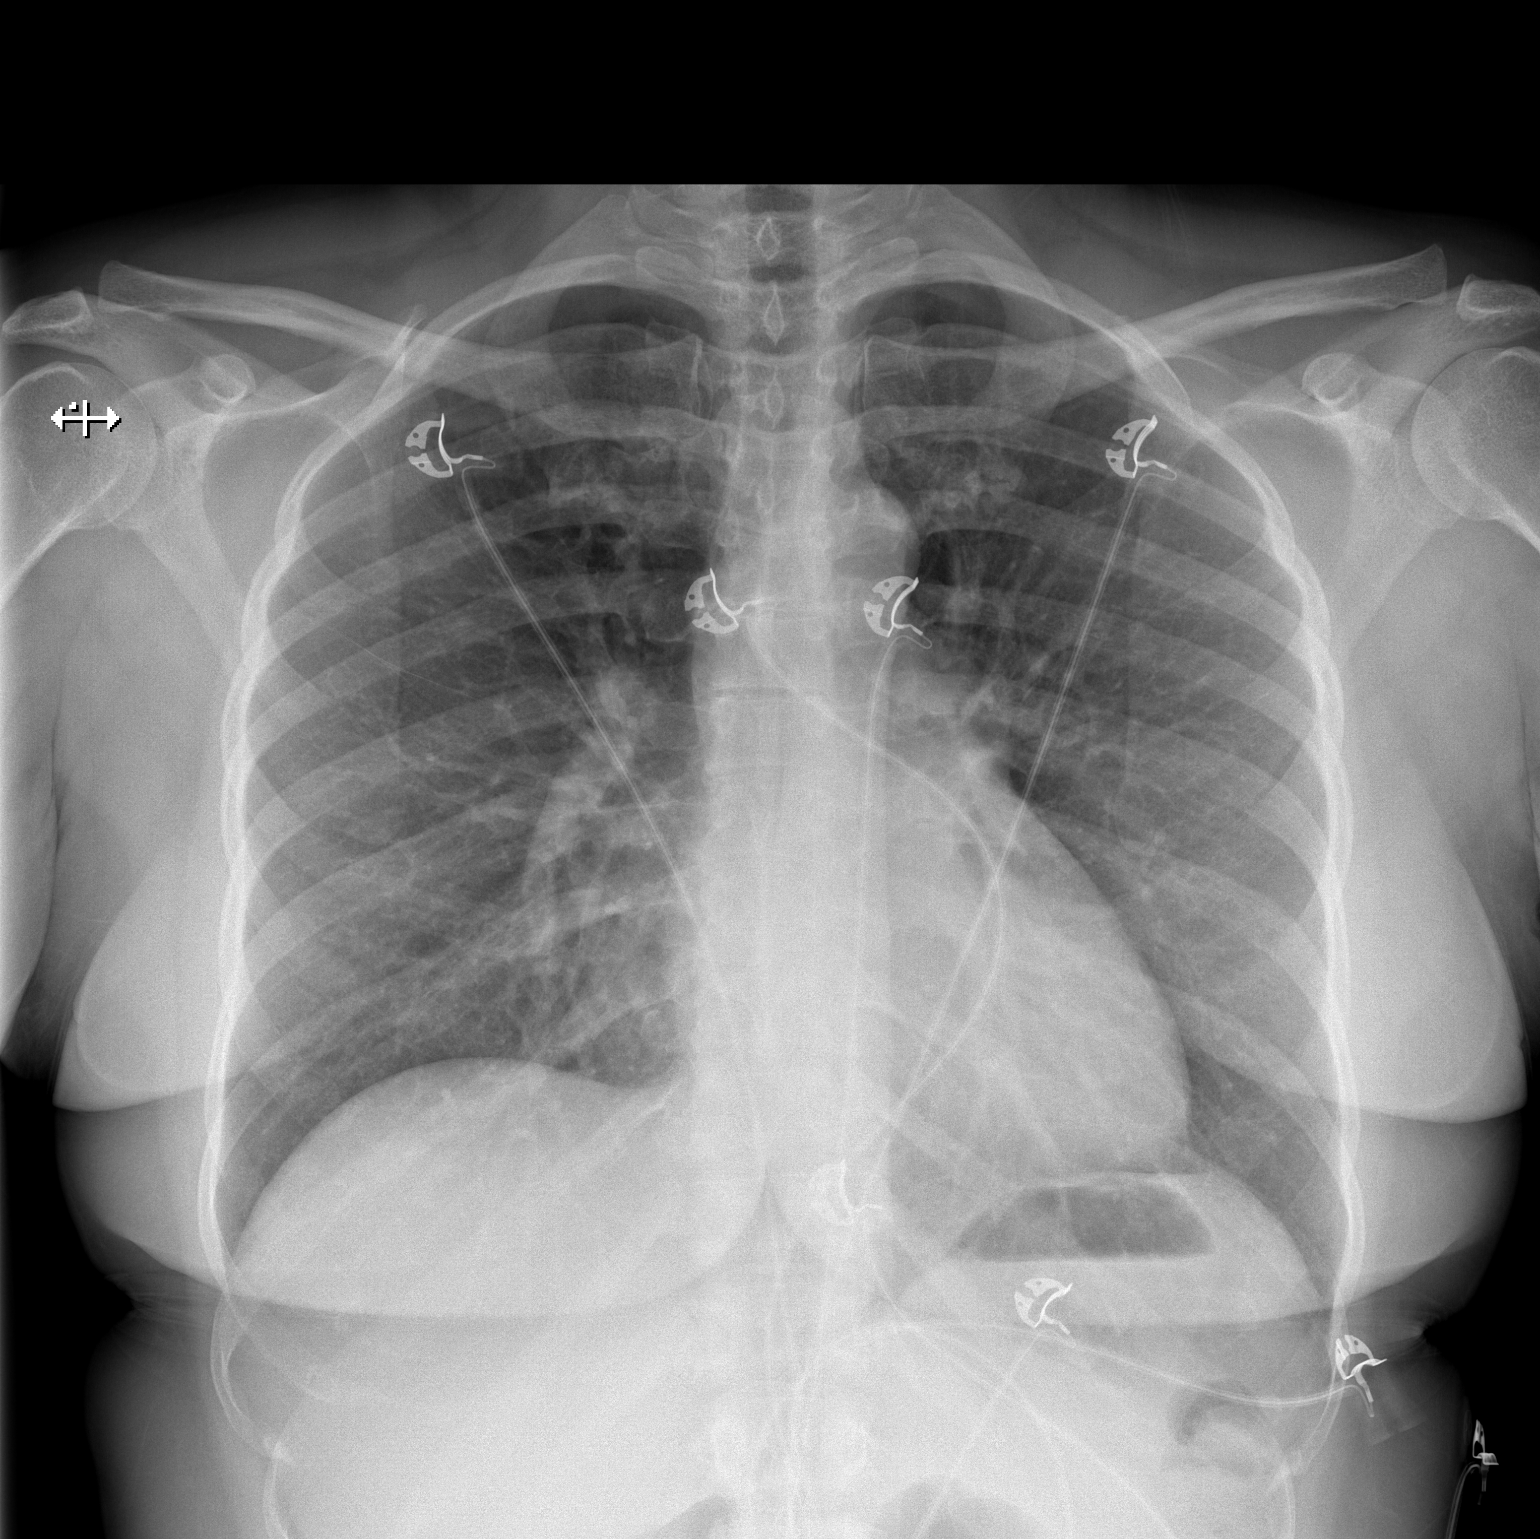

[w chest lat]
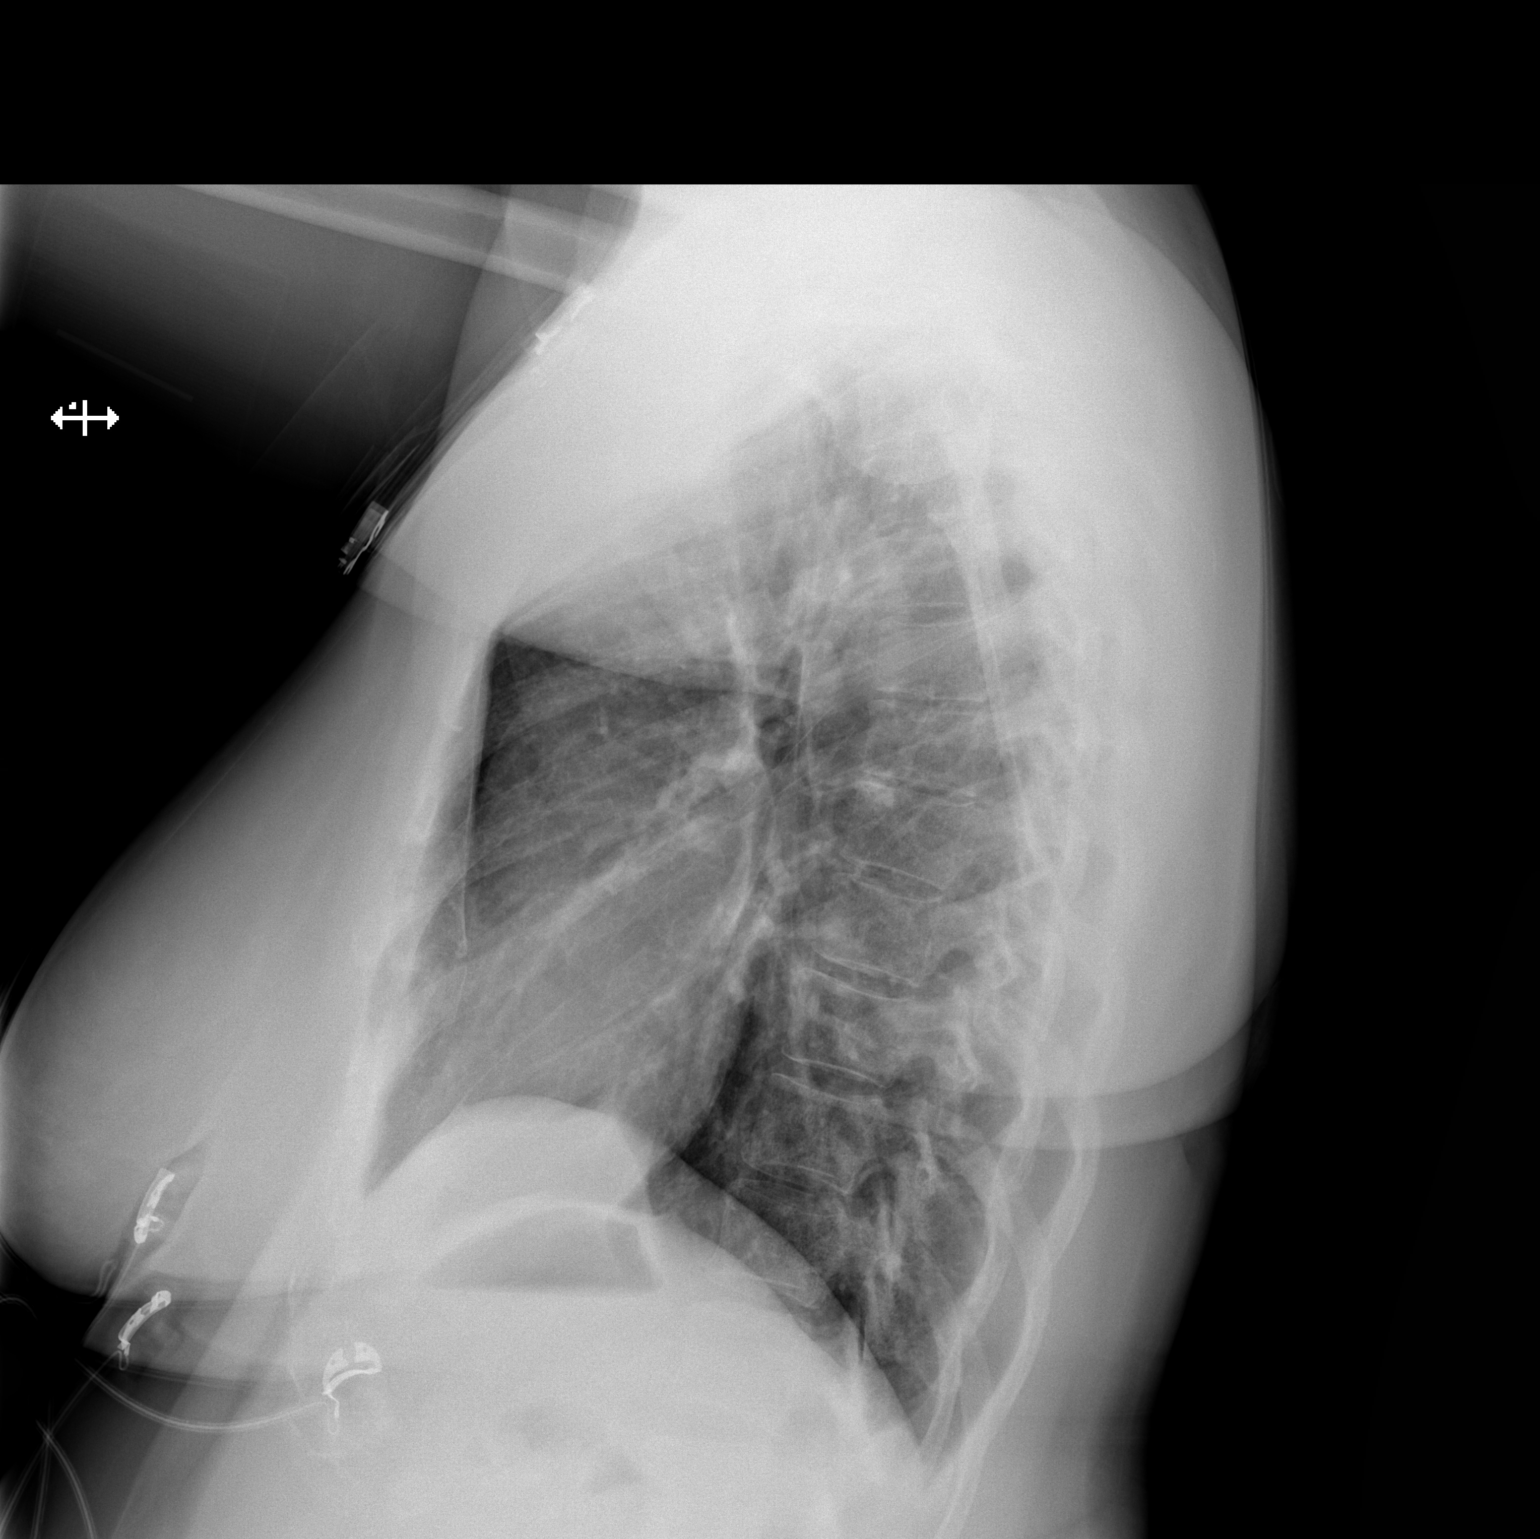

[2 of 2 positions shown; findings below may reference images not displayed]

FINDINGS: The heart and mediastinal contours are within normal limits.

No focal consolidation. No pulmonary edema. No pleural effusion. No
pneumothorax.

No acute osseous abnormality.
IMPRESSION: No active cardiopulmonary disease.

## 2024-03-21 ENCOUNTER — Ambulatory Visit: Admitting: Podiatry

## 2024-04-10 ENCOUNTER — Encounter: Payer: Self-pay | Admitting: Podiatry

## 2024-04-10 ENCOUNTER — Ambulatory Visit: Payer: PRIVATE HEALTH INSURANCE | Admitting: Podiatry

## 2024-04-10 DIAGNOSIS — L852 Keratosis punctata (palmaris et plantaris): Secondary | ICD-10-CM

## 2024-04-10 MED ORDER — DRYSOL 20 % EX SOLN: Freq: Every day | CUTANEOUS | 5 refills | Status: AC

## 2024-04-10 NOTE — Progress Notes (Signed)
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  They have become more painful.  I went to the [facility] and they gave me this Urea 10 lotion and it's not doing anything at all.    44 y.o. female presents for concern of lesions/corns on the bottom of both of her feet that have been present for years now.  She relates they started as spots on the bottom of her feet and then turned to rashes and then she ended up with these corns on the bottom.  She relates they may have been itchy at some time.  They do get painful when she is walking on them.  She has been seen by the [facility] and they have given her urea lotion to use.. Denies any other pedal complaints. Denies n/v/f/c.   Past Medical History:  Diagnosis Date   Anemia    Anxiety    Depression    doing ok   Family history of breast cancer    Fibromyalgia    Mass of perirectal soft tissue - prob seb cyst 05/22/2012   Rheumatoid arthritis ([facility])    Ulcerative colitis ([facility])     Objective:  Physical Exam: Vascular: DP/PT pulses 2/4 bilateral. CFT <3 seconds. Normal hair growth on digits. No edema.  Skin. No lacerations or abrasions bilateral feet.  Multiple hyperkeratotic core lesions noted to plantar feet bilateral mostly in the lateral plantar distribution.  No scaling or erythema noted. Musculoskeletal: MMT 5/5 bilateral lower extremities in DF, PF, Inversion and Eversion. Deceased ROM in DF of ankle joint.  Neurological: Sensation intact to light touch.   Assessment:   1. Keratosis palmo-plantaris punctata      Plan:  Patient was evaluated and treated and all questions answered. -Discussed benign skin lesions with patient and treatment options.  -Hyperkeratotic tissue was debrided with chisel without incident as courtesy today. -Applied salycylic acid treatment to area with dressing. Advised to remove bandaging  tomorrow.  -Encouraged daily moisturizing -Drysol sent to the pharmacy to help with wetting that may be causing increased lesions to form. -Discussed use of pumice stone -Advised good supportive shoes and inserts -Patient to return to office as needed or sooner if condition worsens.   Asberry Failing, DPM    "
